# Patient Record
Sex: Male | Born: 1955 | Hispanic: No | Marital: Married | Smoking: Never smoker
Health system: Southern US, Community
[De-identification: ages and names within clinical notes are randomized; demographics above are authoritative.]

## PROBLEM LIST (undated history)

## (undated) DIAGNOSIS — I219 Acute myocardial infarction, unspecified: Secondary | ICD-10-CM

## (undated) DIAGNOSIS — M1990 Unspecified osteoarthritis, unspecified site: Secondary | ICD-10-CM

## (undated) DIAGNOSIS — F329 Major depressive disorder, single episode, unspecified: Secondary | ICD-10-CM

## (undated) DIAGNOSIS — I2510 Atherosclerotic heart disease of native coronary artery without angina pectoris: Secondary | ICD-10-CM

## (undated) DIAGNOSIS — R7301 Impaired fasting glucose: Secondary | ICD-10-CM

## (undated) DIAGNOSIS — F32A Depression, unspecified: Secondary | ICD-10-CM

## (undated) HISTORY — PX: CARDIAC CATHETERIZATION: SHX172

## (undated) HISTORY — PX: KNEE SURGERY: SHX244

## (undated) HISTORY — PX: COLONOSCOPY: SHX5424

## (undated) HISTORY — PX: KNEE ARTHROSCOPY: SUR90

## (undated) HISTORY — PX: CORONARY ANGIOPLASTY: SHX604

---

## 2000-07-26 ENCOUNTER — Encounter (INDEPENDENT_AMBULATORY_CARE_PROVIDER_SITE_OTHER): Payer: Self-pay | Admitting: *Deleted

## 2000-07-26 LAB — CONVERTED CEMR LAB: PSA: 0.57 ng/mL

## 2000-08-21 ENCOUNTER — Encounter (INDEPENDENT_AMBULATORY_CARE_PROVIDER_SITE_OTHER): Payer: Self-pay | Admitting: *Deleted

## 2000-08-21 LAB — CONVERTED CEMR LAB: PSA: 0.57 ng/mL

## 2002-09-19 ENCOUNTER — Encounter: Payer: Self-pay | Admitting: Emergency Medicine

## 2002-09-19 ENCOUNTER — Emergency Department (HOSPITAL_COMMUNITY): Admission: EM | Admit: 2002-09-19 | Discharge: 2002-09-19 | Payer: Self-pay | Admitting: Emergency Medicine

## 2004-10-06 ENCOUNTER — Ambulatory Visit: Payer: Self-pay | Admitting: Internal Medicine

## 2005-09-16 ENCOUNTER — Ambulatory Visit: Payer: Self-pay | Admitting: Internal Medicine

## 2005-11-24 ENCOUNTER — Ambulatory Visit: Payer: Self-pay | Admitting: Family Medicine

## 2006-03-22 ENCOUNTER — Ambulatory Visit: Payer: Self-pay | Admitting: Internal Medicine

## 2006-09-26 ENCOUNTER — Ambulatory Visit: Payer: Self-pay | Admitting: Internal Medicine

## 2007-01-25 ENCOUNTER — Ambulatory Visit: Payer: Self-pay | Admitting: Family Medicine

## 2007-04-25 ENCOUNTER — Ambulatory Visit: Payer: Self-pay | Admitting: Internal Medicine

## 2007-04-25 ENCOUNTER — Telehealth (INDEPENDENT_AMBULATORY_CARE_PROVIDER_SITE_OTHER): Payer: Self-pay | Admitting: *Deleted

## 2007-04-25 ENCOUNTER — Encounter (INDEPENDENT_AMBULATORY_CARE_PROVIDER_SITE_OTHER): Payer: Self-pay | Admitting: *Deleted

## 2007-07-11 ENCOUNTER — Ambulatory Visit (HOSPITAL_BASED_OUTPATIENT_CLINIC_OR_DEPARTMENT_OTHER): Admission: RE | Admit: 2007-07-11 | Discharge: 2007-07-11 | Payer: Self-pay | Admitting: Orthopedic Surgery

## 2007-09-28 ENCOUNTER — Ambulatory Visit: Payer: Self-pay | Admitting: Family Medicine

## 2007-09-28 DIAGNOSIS — J069 Acute upper respiratory infection, unspecified: Secondary | ICD-10-CM | POA: Insufficient documentation

## 2008-07-24 ENCOUNTER — Telehealth (INDEPENDENT_AMBULATORY_CARE_PROVIDER_SITE_OTHER): Payer: Self-pay | Admitting: *Deleted

## 2008-09-08 ENCOUNTER — Ambulatory Visit: Payer: Self-pay | Admitting: Internal Medicine

## 2008-09-08 DIAGNOSIS — R002 Palpitations: Secondary | ICD-10-CM

## 2008-10-25 DIAGNOSIS — R1084 Generalized abdominal pain: Secondary | ICD-10-CM | POA: Insufficient documentation

## 2008-11-11 ENCOUNTER — Encounter (INDEPENDENT_AMBULATORY_CARE_PROVIDER_SITE_OTHER): Payer: Self-pay | Admitting: Internal Medicine

## 2008-11-11 ENCOUNTER — Ambulatory Visit: Payer: Self-pay | Admitting: Family Medicine

## 2008-11-11 LAB — CONVERTED CEMR LAB
Bacteria, UA: 0
Bilirubin Urine: NEGATIVE
Blood in Urine, dipstick: NEGATIVE
Epithelial cells, urine: 0 /lpf
Glucose, Urine, Semiquant: NEGATIVE
Ketones, urine, test strip: NEGATIVE
Nitrite: NEGATIVE
Protein, U semiquant: NEGATIVE
RBC / HPF: 0
Specific Gravity, Urine: 1.01
Urobilinogen, UA: 0.2
WBC Urine, dipstick: NEGATIVE
pH: 6

## 2008-12-05 ENCOUNTER — Ambulatory Visit: Payer: Self-pay | Admitting: Gastroenterology

## 2008-12-05 DIAGNOSIS — R1032 Left lower quadrant pain: Secondary | ICD-10-CM

## 2008-12-08 LAB — CONVERTED CEMR LAB
AST: 18 units/L (ref 0–37)
BUN: 21 mg/dL (ref 6–23)
Bacteria, UA: NEGATIVE
Basophils Absolute: 0 10*3/uL (ref 0.0–0.1)
Basophils Relative: 0.4 % (ref 0.0–3.0)
Bilirubin Urine: NEGATIVE
Calcium: 9.3 mg/dL (ref 8.4–10.5)
Chloride: 107 meq/L (ref 96–112)
Creatinine, Ser: 1.1 mg/dL (ref 0.4–1.5)
Crystals: NEGATIVE
Eosinophils Absolute: 0.2 10*3/uL (ref 0.0–0.7)
Hemoglobin, Urine: NEGATIVE
Hemoglobin: 15.5 g/dL (ref 13.0–17.0)
Ketones, ur: NEGATIVE mg/dL
Leukocytes, UA: NEGATIVE
MCHC: 34 g/dL (ref 30.0–36.0)
MCV: 82.1 fL (ref 78.0–100.0)
Neutro Abs: 5.2 10*3/uL (ref 1.4–7.7)
RBC: 5.21 M/uL (ref 4.22–5.81)
RDW: 13.6 % (ref 11.5–14.6)
Sed Rate: 7 mm/hr (ref 0–16)
Urobilinogen, UA: 0.2 (ref 0.0–1.0)

## 2008-12-10 ENCOUNTER — Ambulatory Visit: Payer: Self-pay | Admitting: Internal Medicine

## 2008-12-23 ENCOUNTER — Ambulatory Visit: Payer: Self-pay | Admitting: Gastroenterology

## 2009-01-02 ENCOUNTER — Ambulatory Visit: Payer: Self-pay | Admitting: Gastroenterology

## 2009-01-02 ENCOUNTER — Encounter: Payer: Self-pay | Admitting: Gastroenterology

## 2009-01-05 ENCOUNTER — Encounter: Payer: Self-pay | Admitting: Gastroenterology

## 2009-06-15 IMAGING — CT CT ABDOMEN W/ CM
2 of 5 series · 17 of 46 positions shown, 19 images · IV contrast (Omnipaque 300)
Comparison: None

CT ABDOMEN

CLINICAL DATA: Left flank pain radiating to the left lower
quadrant for approximately 6 weeks.  Bouts of diarrhea and
constipation.

CT ABDOMEN AND PELVIS WITH CONTRAST
TECHNIQUE: Multidetector CT imaging of the abdomen and pelvis was
performed using the standard protocol following bolus
administration of intravenous contrast.
Contrast: 100 ml Rmnipaque-RFF intravenously.  Oral contrast was
given.

[Series 2: abd_pel 5.0 b30f st · axial · 0.86mm/px · z∈[-403,-3]mm · 14 of 90 slices shown, 16 images]
[im 5/90  soft-tissue]
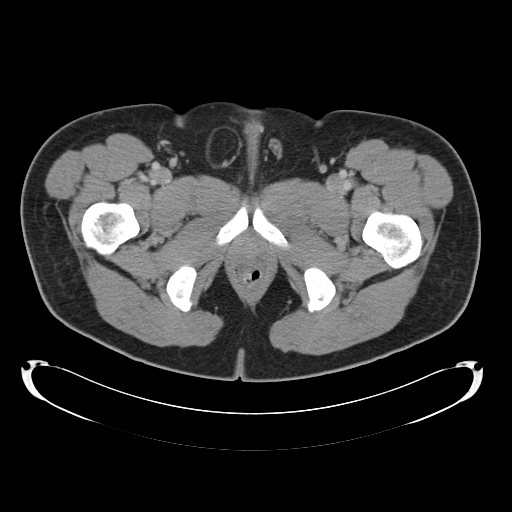
[im 5/90  bone]
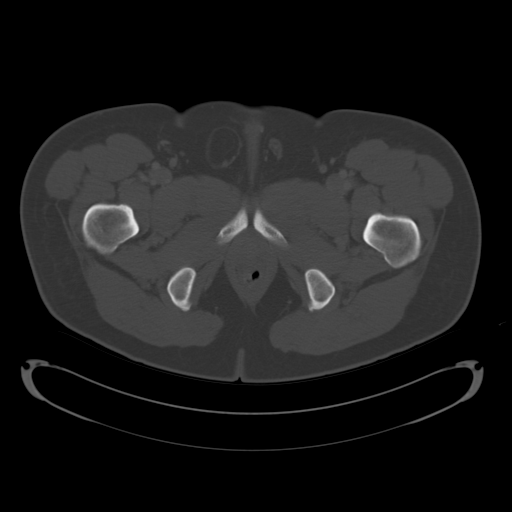
[im 10/90  soft-tissue]
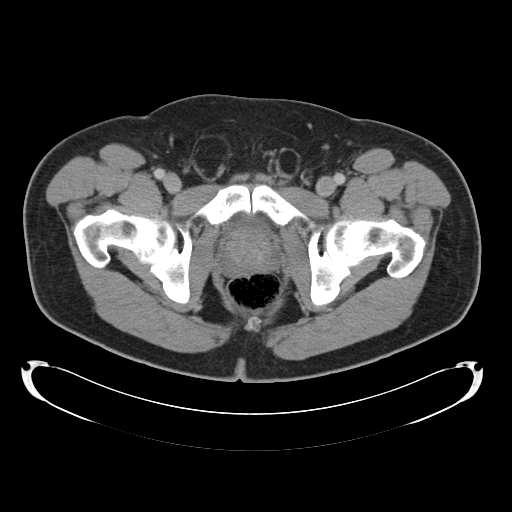
[im 20/90  soft-tissue]
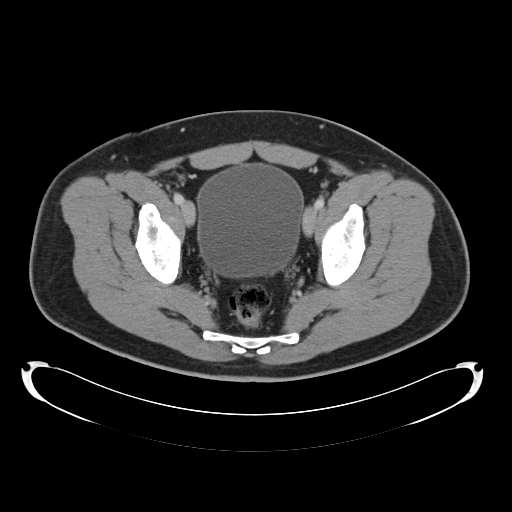
[im 25/90  soft-tissue]
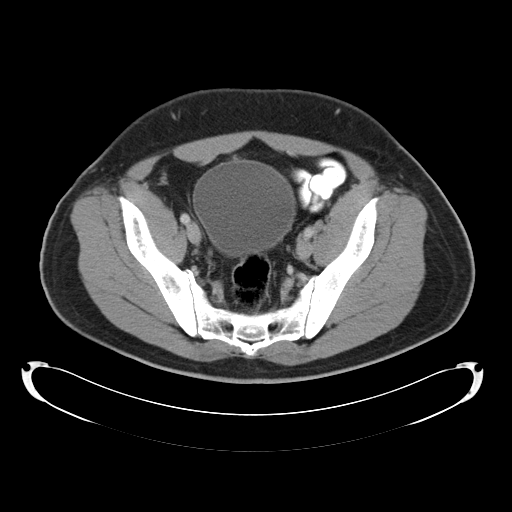
[im 30/90  soft-tissue]
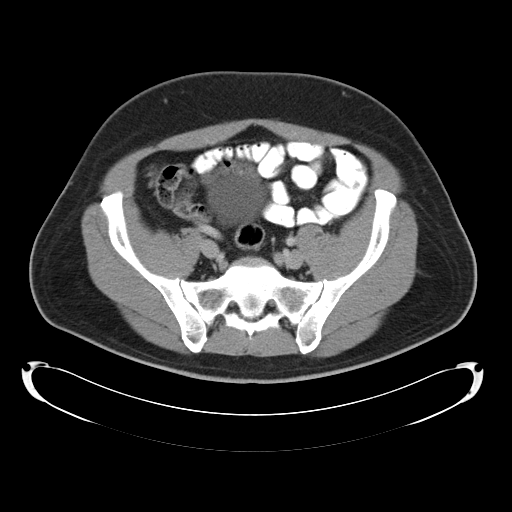
[im 35/90  soft-tissue]
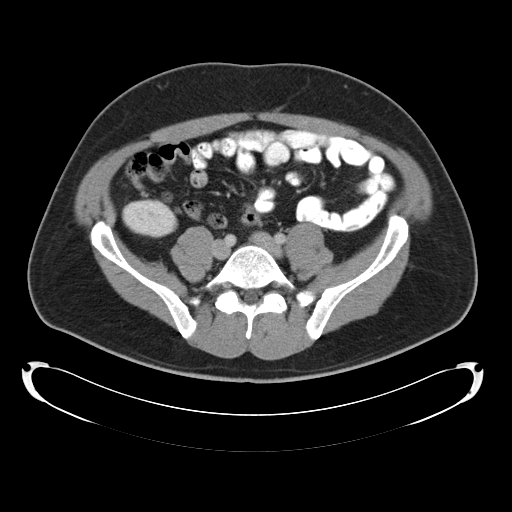
[im 40/90  soft-tissue]
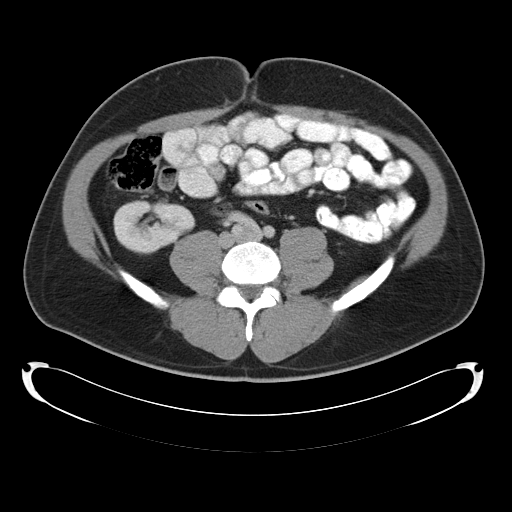
[im 50/90  soft-tissue]
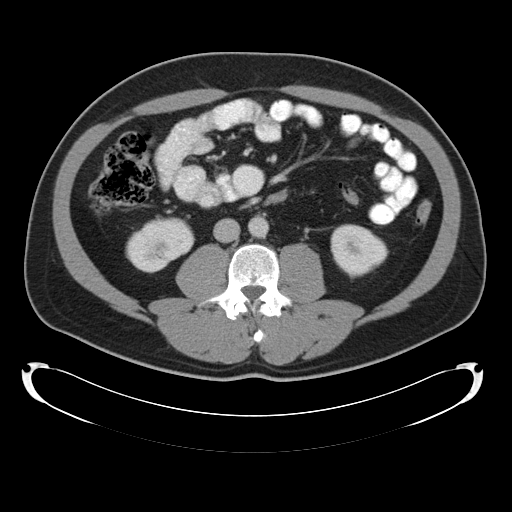
[im 55/90  soft-tissue]
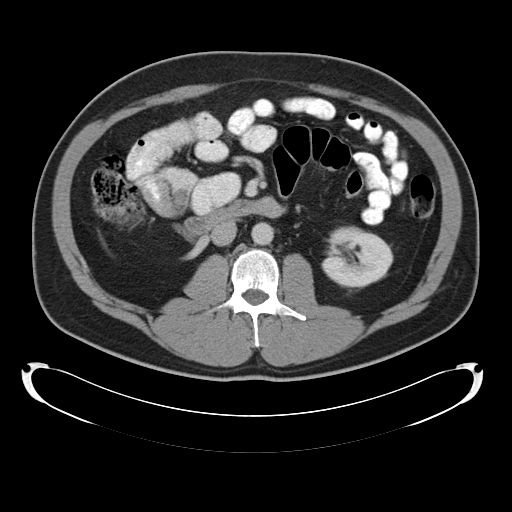
[im 55/90  bone]
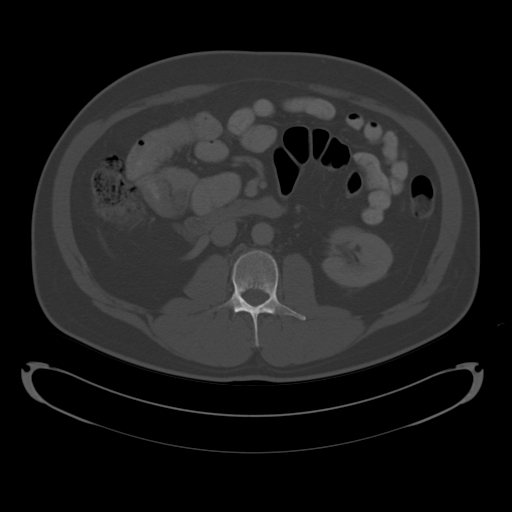
[im 60/90  soft-tissue]
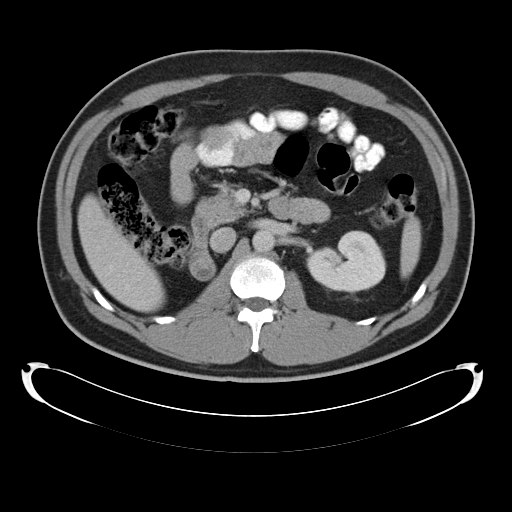
[im 65/90  soft-tissue]
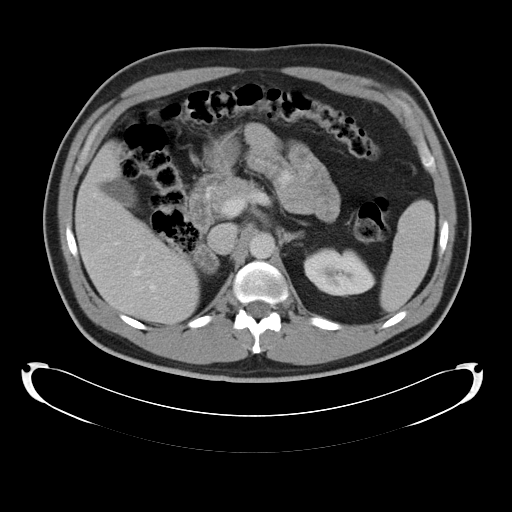
[im 70/90  soft-tissue]
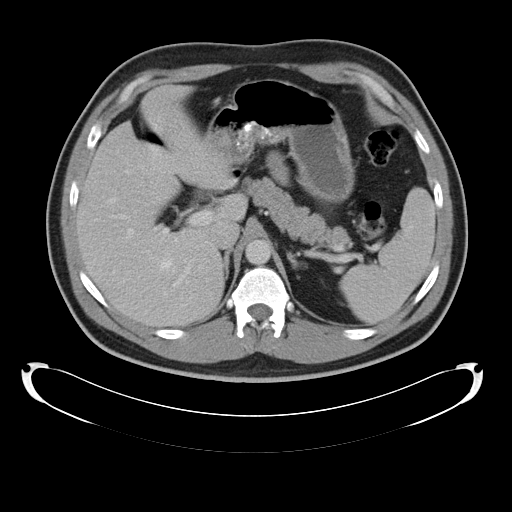
[im 80/90  soft-tissue]
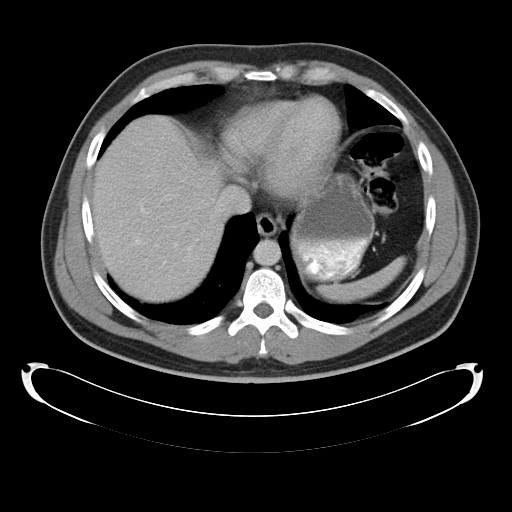
[im 85/90  soft-tissue]
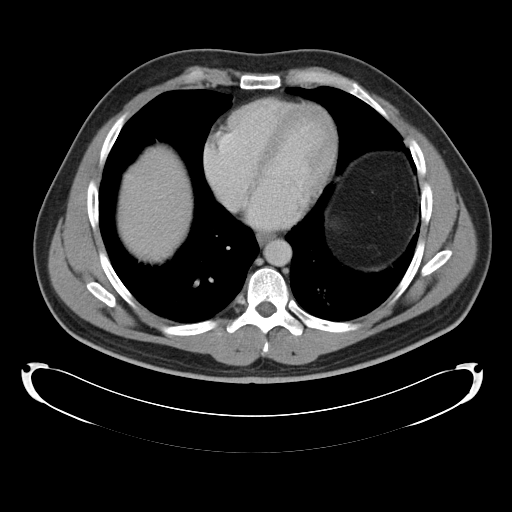

[Series 602: <mpr thick range> · coronal · 0.89mm/px · 3 of 90 slices shown]
[im 30/90  soft-tissue]
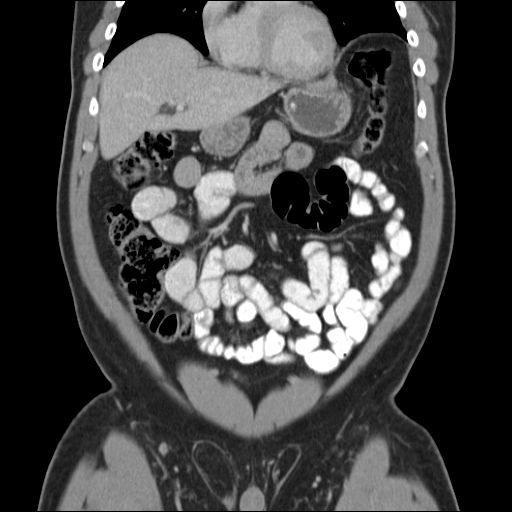
[im 40/90  soft-tissue]
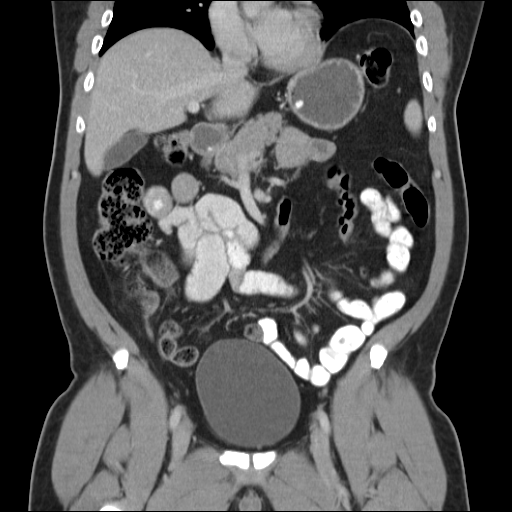
[im 50/90  soft-tissue]
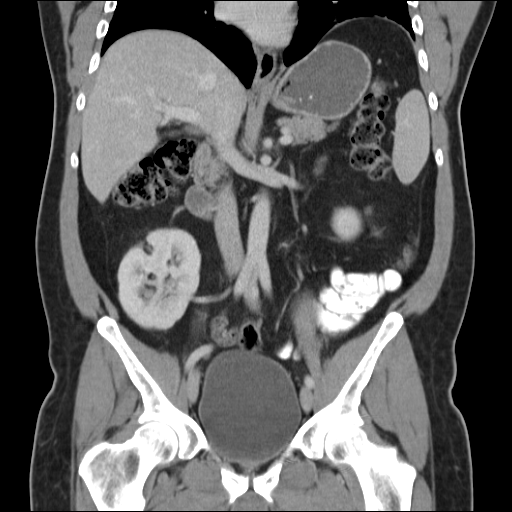

[17 of 46 positions shown; findings below may reference images not displayed]

FINDINGS: The lung bases are clear.  There is no pleural effusion.
The liver, spleen, gallbladder, pancreas and adrenal glands appear
normal.

The left kidney appears normal.  The right kidney is located at the
level of the iliac crest and is malrotated.  Although this study is
not a dedicated CTA, the primary right renal artery appears to be
arising from the right common iliac artery.  A smaller branch
arising from the aorta supplies the upper pole.  There is no
hydronephrosis or focal renal abnormality.

The bowel gas pattern is normal.  No intra-abdominal inflammatory
changes are identified.  There is no adenopathy.
IMPRESSION: 1.  No acute abdominal findings demonstrated.  There is no
explanation for left flank pain.
2.  Malpositioned right kidney within the upper false pelvis with
aberrant right renal artery mainly from the right common iliac
artery.

CT PELVIS
FINDINGS: There is no pelvic mass, fluid collection or
inflammatory process.  No ureteral calculi are identified.  The
bladder, prostate gland and seminal vesicles appear normal.
Asymmetric fat is present within the right inguinal canal.  There
is no herniated bowel.
IMPRESSION: 1.  No acute pelvic findings.
2.  Right inguinal hernia containing fat.

## 2010-12-19 LAB — CONVERTED CEMR LAB
ALT: 27 units/L (ref 0–53)
AST: 30 units/L (ref 0–37)
Albumin: 4 g/dL (ref 3.5–5.2)
Alkaline Phosphatase: 75 units/L (ref 39–117)
BUN: 18 mg/dL (ref 6–23)
Basophils Absolute: 0 10*3/uL (ref 0.0–0.1)
Basophils Relative: 0.1 % (ref 0.0–3.0)
Bilirubin, Direct: 0.1 mg/dL (ref 0.0–0.3)
CO2: 28 meq/L (ref 19–32)
Calcium: 9.1 mg/dL (ref 8.4–10.5)
Chloride: 106 meq/L (ref 96–112)
Cholesterol: 157 mg/dL (ref 0–200)
Creatinine, Ser: 1 mg/dL (ref 0.4–1.5)
Eosinophils Absolute: 0.1 10*3/uL (ref 0.0–0.7)
Eosinophils Relative: 1.2 % (ref 0.0–5.0)
GFR calc Af Amer: 101 mL/min
GFR calc non Af Amer: 83 mL/min
Glucose, Bld: 95 mg/dL (ref 70–99)
HCT: 45.9 % (ref 39.0–52.0)
HDL: 33.2 mg/dL — ABNORMAL LOW (ref >39.0)
Hemoglobin: 15.7 g/dL (ref 13.0–17.0)
LDL Cholesterol: 107 mg/dL — ABNORMAL HIGH (ref 0–99)
Lymphocytes Relative: 14.3 % (ref 12.0–46.0)
MCHC: 34.2 g/dL (ref 30.0–36.0)
MCV: 85.7 fL (ref 78.0–100.0)
Monocytes Absolute: 0.6 10*3/uL (ref 0.1–1.0)
Monocytes Relative: 7.3 % (ref 3.0–12.0)
Neutro Abs: 6.2 10*3/uL (ref 1.4–7.7)
Neutrophils Relative %: 77.1 % — ABNORMAL HIGH (ref 43.0–77.0)
PSA: 1.62 ng/mL (ref 0.10–4.00)
Phosphorus: 2.9 mg/dL (ref 2.3–4.6)
Platelets: 183 10*3/uL (ref 150–400)
Potassium: 4 meq/L (ref 3.5–5.1)
RBC: 5.36 M/uL (ref 4.22–5.81)
RDW: 12.7 % (ref 11.5–14.6)
Sodium: 142 meq/L (ref 135–145)
TSH: 1.48 microintl units/mL (ref 0.35–5.50)
Total Bilirubin: 1 mg/dL (ref 0.3–1.2)
Total CHOL/HDL Ratio: 4.7
Total Protein: 6.8 g/dL (ref 6.0–8.3)
Triglycerides: 85 mg/dL (ref 0–149)
VLDL: 17 mg/dL (ref 0–40)
WBC: 8 10*3/uL (ref 4.5–10.5)

## 2011-04-05 NOTE — Op Note (Signed)
NAMEJONATHA, Maurice Rios             ACCOUNT NO.:  [id number]   MEDICAL RECORD NO.:  [id number]          PATIENT TYPE:  AMB   LOCATION:  DSC                          FACILITY:  [facility]   PHYSICIAN:  Mila Homer. Sherlean Foot, M.D. DATE OF BIRTH:  1956/04/07   DATE OF PROCEDURE:  07/11/2007  DATE OF DISCHARGE:                               OPERATIVE REPORT   SURGEON:  Mila Homer. Sherlean Foot, M.D.   ASSISTANT:  None.   ANESTHESIA:  MAC.   PREOPERATIVE DIAGNOSIS:  Right knee medial meniscus tear,  osteoarthritis.   POSTOPERATIVE DIAGNOSIS:  Right knee medial meniscus tear,  osteoarthritis, plica syndrome.   PROCEDURE:  Right knee arthroscopy with partial medial meniscectomy,  chondroplasty in the medial compartment, plica resection of the  patellofemoral compartment.   INDICATIONS FOR PROCEDURE:  The patient is a 55 year old with mechanical  symptoms and MRI evidence of meniscus tear.  Informed consent was  obtained.   DESCRIPTION OF PROCEDURE:  The patient was laid supine, administered MAC  anesthesia.  Right leg was prepped and draped in usual sterile fashion.  Inferolateral and inferomedial portals were created with a #11 blade,  blunt trocar and cannula.  Diagnostic arthroscopy revealed  chondromalacia of the medial compartment as well as a very complex tear  of the medial meniscus.  Chondroplasty was performed in that medial  compartment as was a partial medial meniscectomy with straight basket  forceps and the [employer] shaver.  ACL and PCL were normal.  Went to  the figure four position, lateral compartment was normal.  Patellofemoral compartment was also normal, had a very large synovitic  hyperemic plica.  This was debrided with a [employer].  Then  lavaged and closed with 4-0 nylon sutures, dressed with Xeroform  dressing sponges, sterile Webril and Ace wrap.  Complications none.  Drains none.           ______________________________  Mila Homer. Sherlean Foot, M.D.     SDL/MEDQ  D:  07/11/2007  T:  07/12/2007  Job:  [id number]

## 2013-11-06 ENCOUNTER — Encounter: Payer: Self-pay | Admitting: Gastroenterology

## 2014-06-05 ENCOUNTER — Encounter: Payer: Self-pay | Admitting: Gastroenterology

## 2015-10-27 ENCOUNTER — Encounter: Payer: Self-pay | Admitting: Gastroenterology

## 2017-01-20 ENCOUNTER — Other Ambulatory Visit
Admission: RE | Admit: 2017-01-20 | Discharge: 2017-01-20 | Disposition: A | Discharge status: Home / Self Care | Payer: BLUE CROSS/BLUE SHIELD | Source: Ambulatory Visit | Attending: Family Medicine | Admitting: Family Medicine

## 2017-01-20 ENCOUNTER — Inpatient Hospital Stay
Admission: EM | Admit: 2017-01-20 | Discharge: 2017-01-24 | DRG: 247 | Disposition: A | Discharge status: Home / Self Care | Payer: BLUE CROSS/BLUE SHIELD | Attending: Internal Medicine | Admitting: Internal Medicine

## 2017-01-20 ENCOUNTER — Emergency Department: Payer: BLUE CROSS/BLUE SHIELD

## 2017-01-20 ENCOUNTER — Other Ambulatory Visit: Payer: Self-pay

## 2017-01-20 ENCOUNTER — Encounter: Payer: Self-pay | Admitting: Emergency Medicine

## 2017-01-20 DIAGNOSIS — I10 Essential (primary) hypertension: Secondary | ICD-10-CM | POA: Diagnosis present

## 2017-01-20 DIAGNOSIS — N390 Urinary tract infection, site not specified: Secondary | ICD-10-CM | POA: Diagnosis present

## 2017-01-20 DIAGNOSIS — E860 Dehydration: Secondary | ICD-10-CM | POA: Diagnosis present

## 2017-01-20 DIAGNOSIS — Z8249 Family history of ischemic heart disease and other diseases of the circulatory system: Secondary | ICD-10-CM | POA: Diagnosis not present

## 2017-01-20 DIAGNOSIS — R9431 Abnormal electrocardiogram [ECG] [EKG]: Secondary | ICD-10-CM | POA: Diagnosis not present

## 2017-01-20 DIAGNOSIS — R072 Precordial pain: Secondary | ICD-10-CM | POA: Diagnosis present

## 2017-01-20 DIAGNOSIS — I214 Non-ST elevation (NSTEMI) myocardial infarction: Principal | ICD-10-CM | POA: Diagnosis present

## 2017-01-20 DIAGNOSIS — R079 Chest pain, unspecified: Secondary | ICD-10-CM | POA: Diagnosis present

## 2017-01-20 DIAGNOSIS — K5900 Constipation, unspecified: Secondary | ICD-10-CM | POA: Diagnosis present

## 2017-01-20 DIAGNOSIS — R52 Pain, unspecified: Secondary | ICD-10-CM

## 2017-01-20 HISTORY — DX: Impaired fasting glucose: R73.01

## 2017-01-20 HISTORY — DX: Depression, unspecified: F32.A

## 2017-01-20 HISTORY — DX: Unspecified osteoarthritis, unspecified site: M19.90

## 2017-01-20 HISTORY — DX: Major depressive disorder, single episode, unspecified: F32.9

## 2017-01-20 LAB — CBC WITH DIFFERENTIAL/PLATELET
Basophils Absolute: 0 10*3/uL (ref 0–0.1)
Basophils Relative: 1 %
EOS PCT: 1 %
Eosinophils Absolute: 0.1 10*3/uL (ref 0–0.7)
HCT: 43.4 % (ref 40.0–52.0)
Hemoglobin: 15.1 g/dL (ref 13.0–18.0)
LYMPHS ABS: 1.5 10*3/uL (ref 1.0–3.6)
LYMPHS PCT: 15 %
MCH: 28.5 pg (ref 26.0–34.0)
MCHC: 34.7 g/dL (ref 32.0–36.0)
MCV: 82 fL (ref 80.0–100.0)
MONO ABS: 0.8 10*3/uL (ref 0.2–1.0)
MONOS PCT: 8 %
Neutro Abs: 7.3 10*3/uL — ABNORMAL HIGH (ref 1.4–6.5)
Neutrophils Relative %: 75 %
PLATELETS: 199 10*3/uL (ref 150–440)
RBC: 5.3 MIL/uL (ref 4.40–5.90)
RDW: 15.4 % — ABNORMAL HIGH (ref 11.5–14.5)
WBC: 9.6 10*3/uL (ref 3.8–10.6)

## 2017-01-20 LAB — COMPREHENSIVE METABOLIC PANEL
ALBUMIN: 4.5 g/dL (ref 3.5–5.0)
ALT: 23 U/L (ref 17–63)
AST: 28 U/L (ref 15–41)
Alkaline Phosphatase: 91 U/L (ref 38–126)
Anion gap: 8 (ref 5–15)
BUN: 24 mg/dL — AB (ref 6–20)
CHLORIDE: 106 mmol/L (ref 101–111)
CO2: 24 mmol/L (ref 22–32)
Calcium: 9.2 mg/dL (ref 8.9–10.3)
Creatinine, Ser: 1.14 mg/dL (ref 0.61–1.24)
GFR calc Af Amer: >60 mL/min (ref >60)
GLUCOSE: 117 mg/dL — AB (ref 65–99)
Potassium: 3.6 mmol/L (ref 3.5–5.1)
SODIUM: 138 mmol/L (ref 135–145)
Total Bilirubin: 0.9 mg/dL (ref 0.3–1.2)
Total Protein: 7.4 g/dL (ref 6.5–8.1)

## 2017-01-20 LAB — PROTIME-INR
INR: 1.12
Prothrombin Time: 14.5 seconds (ref 11.4–15.2)

## 2017-01-20 LAB — TROPONIN I
TROPONIN I: 1.76 ng/mL — AB (ref <0.03)
TROPONIN I: 1.66 ng/mL — AB (ref <0.03)
TROPONIN I: 1.72 ng/mL — AB (ref <0.03)

## 2017-01-20 LAB — CKMB (ARMC ONLY): CK, MB: 6.7 ng/mL — ABNORMAL HIGH (ref 0.5–5.0)

## 2017-01-20 LAB — CK: Total CK: 208 U/L (ref 49–397)

## 2017-01-20 LAB — APTT: aPTT: 33 seconds (ref 24–36)

## 2017-01-20 LAB — BRAIN NATRIURETIC PEPTIDE: B Natriuretic Peptide: 27 pg/mL (ref 0.0–100.0)

## 2017-01-20 MED ORDER — SENNOSIDES-DOCUSATE SODIUM 8.6-50 MG PO TABS
1.0000 | ORAL_TABLET | Freq: Every evening | ORAL | Status: DC | PRN
  Administered 2017-01-21 – 2017-01-22 (×2): 1 via ORAL
  Filled 2017-01-20 (×2): qty 1

## 2017-01-20 MED ORDER — SODIUM CHLORIDE 0.9 % IV SOLN
INTRAVENOUS | Status: DC
  Administered 2017-01-20: 20:00:00 via INTRAVENOUS

## 2017-01-20 MED ORDER — ATORVASTATIN CALCIUM 20 MG PO TABS
40.0000 mg | ORAL_TABLET | Freq: Every day | ORAL | Status: DC
  Administered 2017-01-21 – 2017-01-23 (×3): 40 mg via ORAL
  Filled 2017-01-20 (×3): qty 2

## 2017-01-20 MED ORDER — ASPIRIN 81 MG PO CHEW
324.0000 mg | CHEWABLE_TABLET | Freq: Once | ORAL | Status: AC
  Administered 2017-01-20: 324 mg via ORAL
  Filled 2017-01-20: qty 4

## 2017-01-20 MED ORDER — NITROGLYCERIN 0.4 MG SL SUBL: 0.4000 mg | SUBLINGUAL_TABLET | SUBLINGUAL | Status: DC | PRN

## 2017-01-20 MED ORDER — ENOXAPARIN SODIUM 40 MG/0.4ML ~~LOC~~ SOLN
40.0000 mg | SUBCUTANEOUS | Status: DC
  Administered 2017-01-20 – 2017-01-23 (×4): 40 mg via SUBCUTANEOUS
  Filled 2017-01-20 (×4): qty 0.4

## 2017-01-20 MED ORDER — ACETAMINOPHEN 325 MG PO TABS: 650.0000 mg | ORAL_TABLET | ORAL | Status: DC | PRN

## 2017-01-20 MED ORDER — ONDANSETRON HCL 4 MG/2ML IJ SOLN: 4.0000 mg | Freq: Four times a day (QID) | INTRAMUSCULAR | Status: DC | PRN

## 2017-01-20 MED ORDER — ASPIRIN EC 325 MG PO TBEC
325.0000 mg | DELAYED_RELEASE_TABLET | Freq: Every day | ORAL | Status: DC
  Administered 2017-01-21 – 2017-01-22 (×2): 325 mg via ORAL
  Filled 2017-01-20 (×2): qty 1

## 2017-01-20 MED ORDER — METOPROLOL TARTRATE 25 MG PO TABS
12.5000 mg | ORAL_TABLET | Freq: Two times a day (BID) | ORAL | Status: DC
  Administered 2017-01-20 – 2017-01-24 (×7): 12.5 mg via ORAL
  Filled 2017-01-20 (×4): qty 1
  Filled 2017-01-20: qty 2
  Filled 2017-01-20 (×2): qty 1

## 2017-01-20 NOTE — H&P (Addendum)
[facility] [place] [facility]   PATIENT NAME: Maurice Rios    MR#:  [id number]  DATE OF BIRTH:  1956-04-12  DATE OF ADMISSION:  01/20/2017  PRIMARY CARE PHYSICIAN: Dorothey BasemanAVID BRONSTEIN, MD   REQUESTING/REFERRING PHYSICIAN: Merrily BrittleNeil Rifenbark, MD  CHIEF COMPLAINT:   Chief Complaint  Patient presents with  . Chest Pain   Chest pain 2 days ago. HISTORY OF PRESENT ILLNESS:  Maurice Rios  is a 61 y.o. male with a known history of Arthritis and depression. The patient had a 1 episode of chest pain 2 days ago, which resolved. He denies any symptoms. He went to PCPs office today for checkup. He was found abnormal EKG and elevated troponin at 1.76. Per ED physician, cardiologist Dr. Darrold JunkerParaschos ssuggests aspirin and echocardiograph but no heparin drip at this time.  PAST MEDICAL HISTORY:   Past Medical History:  Diagnosis Date  . Arthritis   . Depression   . Impaired fasting glucose     PAST SURGICAL HISTORY:   Past Surgical History:  Procedure Laterality Date  . KNEE ARTHROSCOPY    . KNEE SURGERY      SOCIAL HISTORY:   Social History  Substance Use Topics  . Smoking status: Never Smoker  . Smokeless tobacco: Never Used  . Alcohol use 0.6 oz/week    1 Cans of beer per week    FAMILY HISTORY:   Family History  Problem Relation Age of Onset  . Hypertension Father   . Kidney disease Father   . Ovarian cancer Mother   . Heart attack Mother     DRUG ALLERGIES:  No Known Allergies  REVIEW OF SYSTEMS:   Review of Systems  Constitutional: Negative for chills, fever and malaise/fatigue.  HENT: Negative for congestion.   Respiratory: Negative for cough, shortness of breath and stridor.   Cardiovascular: Positive for chest pain. Negative for palpitations, orthopnea and leg swelling.  Gastrointestinal: Positive for constipation. Negative for abdominal pain, blood in stool, diarrhea, melena, nausea and vomiting.  Genitourinary: Negative for dysuria  and hematuria.  Musculoskeletal: Negative for joint pain.  Skin: Negative for itching and rash.  Neurological: Negative for dizziness, focal weakness, loss of consciousness, weakness and headaches.  Psychiatric/Behavioral: Negative for depression. The patient does not have insomnia.        Stress from family    MEDICATIONS AT HOME:   Prior to Admission medications   Not on File      VITAL SIGNS:  Blood pressure (!) 143/74, pulse 76, temperature 98.1 F (36.7 C), temperature source Oral, resp. rate (!) 21, height 5\' 6"  (1.676 m), weight 195 lb (88.5 kg), SpO2 98 %.  PHYSICAL EXAMINATION:  Physical Exam  GENERAL:  61 y.o.-year-old patient lying in the bed with no acute distress.  EYES: Pupils equal, round, reactive to light and accommodation. No scleral icterus. Extraocular muscles intact.  HEENT: Head atraumatic, normocephalic. Oropharynx and nasopharynx clear.  NECK:  Supple, no jugular venous distention. No thyroid enlargement, no tenderness.  LUNGS: Normal breath sounds bilaterally, no wheezing, rales,rhonchi or crepitation. No use of accessory muscles of respiration.  CARDIOVASCULAR: S1, S2 normal. No murmurs, rubs, or gallops.  ABDOMEN: Soft, nontender, nondistended. Bowel sounds present. No organomegaly or mass.  EXTREMITIES: No pedal edema, cyanosis, or clubbing.  NEUROLOGIC: Cranial nerves II through XII are intact. Muscle strength 5/5 in all extremities. Sensation intact. Gait not checked.  PSYCHIATRIC: The patient is alert and oriented x 3.  SKIN: No obvious rash, lesion,  or ulcer.   LABORATORY PANEL:   CBC  Recent Labs Lab 01/20/17 1745  WBC 9.6  HGB 15.1  HCT 43.4  PLT 199   ------------------------------------------------------------------------------------------------------------------  Chemistries   Recent Labs Lab 01/20/17 1745  NA 138  K 3.6  CL 106  CO2 24  GLUCOSE 117*  BUN 24*  CREATININE 1.14  CALCIUM 9.2  AST 28  ALT 23  ALKPHOS 91    BILITOT 0.9   ------------------------------------------------------------------------------------------------------------------  Cardiac Enzymes  Recent Labs Lab 01/20/17 1745  TROPONINI 1.66*   ------------------------------------------------------------------------------------------------------------------  RADIOLOGY:  Dg Chest Port 1 View  Result Date: 01/20/2017 CLINICAL DATA:  61 y/o  M; NSTEMI. EXAM: PORTABLE CHEST 1 VIEW COMPARISON:  None. FINDINGS: The heart size and mediastinal contours are within normal limits. Both lungs are clear. The visualized skeletal structures are unremarkable. IMPRESSION: No active disease. Electronically Signed   By: Mitzi Hansen M.D.   On: 01/20/2017 18:17      IMPRESSION AND PLAN:   Non-STEMI. The patient will be admitted to medical floor. He was treated with aspirin 325 mg 1 dose in the ED, I will continue aspirin, add Lipitor and low-dose Lopressor, check lipid panel and echocardiograph. Follow-up cardiology consult. Follow-up troponin level, nothing by mouth after midnight in case for cardiac catheter tomorrow.  Dehydration. Start normal saline IV and follow-up BMP.  All the records are reviewed and case discussed with ED provider. Management plans discussed with the patient, his wife and they are in agreement.  CODE STATUS: Full code  TOTAL TIME TAKING CARE OF THIS PATIENT: 50 minutes.    Shaune Pollack M.D on 01/20/2017 at 6:42 PM  Between 7am to 6pm - Pager - [phone]  After 6pm go to [email or web address] - Social research officer, government  [facility] [place] [facility]  Office  [phone]  CC: Primary care physician; Dorothey Baseman, MD   Note: This dictation was prepared with Dragon dictation along with smaller phrase technology. Any transcriptional errors that result from this process are unintentional.

## 2017-01-20 NOTE — ED Provider Notes (Signed)
[facility] Emergency Department Provider Note  ____________________________________________   First MD Initiated Contact with Patient 01/20/17 1742     (approximate)  I have reviewed the triage vital signs and the nursing notes.   HISTORY  Chief Complaint Chest Pain    HPI Maurice Rios is a 61 y.o. male comes to the emergency department at the request of his primary care physician for an elevated troponin. 2 days ago he was at home mowing the lawn when he began to feel severe crushing substernal left chest pain and shortness of breath. It improved with rest and worsened with exertion. His wife called 911 and when EMS arrived the patient was no longer symptomatic and he had a normal EKG. EMS recommended the patient come to the emergency department for further evaluation but she declined. Today he went to his primary care physician who checked an outpatient troponin which was positive so he sent him to the emergency department. The patient has no coronary artery history. He has a history of diabetes or hypertension. He's had no pain since Wednesday. He's never had a DVT or pulmonary embolism. Pain was not ripping or tearing and did not go to his back. He did not vomit.   Past Medical History:  Diagnosis Date  . Arthritis   . Depression   . Impaired fasting glucose     Patient Active Problem List   Diagnosis Date Noted  . NSTEMI (non-ST elevated myocardial infarction) (HCC) 01/20/2017  . ABDOMINAL PAIN-LLQ 12/05/2008  . ABDOMINAL PAIN, GENERALIZED 10/25/2008  . PALPITATIONS 09/08/2008  . URI 09/28/2007    Past Surgical History:  Procedure Laterality Date  . KNEE ARTHROSCOPY    . KNEE SURGERY      Prior to Admission medications   Not on File    Allergies Patient has no known allergies.  Family History  Problem Relation Age of Onset  . Hypertension Father   . Kidney disease Father   . Ovarian cancer Mother     Social History Social  History  Substance Use Topics  . Smoking status: Never Smoker  . Smokeless tobacco: Never Used  . Alcohol use 0.6 oz/week    1 Cans of beer per week    Review of Systems Constitutional: No fever/chills Eyes: No visual changes. ENT: No sore throat. Cardiovascular: Denies chest pain. Respiratory: Denies shortness of breath. Gastrointestinal: No abdominal pain.  No nausea, no vomiting.  No diarrhea.  No constipation. Genitourinary: Negative for dysuria. Musculoskeletal: Negative for back pain. Skin: Negative for rash. Neurological: Negative for headaches, focal weakness or numbness.  10-point ROS otherwise negative.  ____________________________________________   PHYSICAL EXAM:  VITAL SIGNS: ED Triage Vitals [01/20/17 1742]  Enc Vitals Group     BP      Pulse Rate 71     Resp 15     Temp 98.1 F (36.7 C)     Temp Source Oral     SpO2 95 %     Weight      Height      Head Circumference      Peak Flow      Pain Score      Pain Loc      Pain Edu?      Excl. in GC?     Constitutional: Alert and oriented x 4 well appearing nontoxic no diaphoresis speaks in full, clear sentences Eyes: PERRL EOMI. Head: Atraumatic. Nose: No congestion/rhinnorhea. Mouth/Throat: No trismus Neck: No stridor.  Cardiovascular: Normal rate, regular rhythm. Grossly normal heart sounds.  Good peripheral circulation. Respiratory: Normal respiratory effort.  No retractions. Lungs CTAB and moving good air Gastrointestinal: Soft nondistended nontender no rebound no guarding no peritonitis no McBurney's tenderness negative Rovsing's no costovertebral tenderness negative Murphy's Musculoskeletal: No lower extremity edema   Neurologic:  Normal speech and language. No gross focal neurologic deficits are appreciated. Skin:  Skin is warm, dry and intact. No rash noted. Psychiatric: Mood and affect are normal. Speech and behavior are normal.  ____________________________________________    LABS (all labs ordered are listed, but only abnormal results are displayed)  Labs Reviewed  COMPREHENSIVE METABOLIC PANEL - Abnormal; Notable for the following:       Result Value   Glucose, Bld 117 (*)    BUN 24 (*)    All other components within normal limits  CBC WITH DIFFERENTIAL/PLATELET - Abnormal; Notable for the following:    RDW 15.4 (*)    Neutro Abs 7.3 (*)    All other components within normal limits  TROPONIN I - Abnormal; Notable for the following:    Troponin I 1.66 (*)    All other components within normal limits  BRAIN NATRIURETIC PEPTIDE  PROTIME-INR  APTT  TROPONIN I  TROPONIN I   _Elevated troponin consistent with cardiac damage ___________________________________________  EKG  ED ECG REPORT I, Merrily Brittle, the attending physician, personally viewed and interpreted this ECG.  Date: 01/20/2017 EKG Time: 1741 Rate: 66 Rhythm: normal sinus rhythm QRS Axis: normal Intervals: normal ST/T Wave abnormalities: T-wave inversion in V3 V4 and biphasic T waves V5 no ST elevation Conduction Disturbances: none Narrative Interpretation: Consistent with ischemia  ____________________________________________  RADIOLOGY  Chest x-ray unremarkable ____________________________________________   PROCEDURES  Procedure(s) performed: no  Procedures  Critical Care performed: yes  CRITICAL CARE Performed by: Merrily Brittle   Total critical care time: 35 minutes  Critical care time was exclusive of separately billable procedures and treating other patients.  Critical care was necessary to treat or prevent imminent or life-threatening deterioration.  Critical care was time spent personally by me on the following activities: development of treatment plan with patient and/or surrogate as well as nursing, discussions with consultants, evaluation of patient's response to treatment, examination of patient, obtaining history from patient or surrogate, ordering  and performing treatments and interventions, ordering and review of laboratory studies, ordering and review of radiographic studies, pulse oximetry and re-evaluation of patient's condition.   ____________________________________________   INITIAL IMPRESSION / ASSESSMENT AND PLAN / ED COURSE  Pertinent labs & imaging results that were available during my care of the patient were reviewed by me and considered in my medical decision making (see chart for details).  On arrival the patient is very well-appearing and not currently having any active chest pain. His outpatient troponin today was 1.76 which may represent ongoing cardiac ischemia versus a completed event    ----------------------------------------- 5:57 PM on 01/20/2017 -----------------------------------------  I spoke with Dr. Darrold Junker of cardiology who recommends inpatient admission to trend troponins and an echocardiogram. He says that because the patient is asymptomatic at this time he does not recommend heparin.  I discussed the case with the hospitalist who is graciously agreed to admit the patient to his service. The patient's second troponin is down trending likely representing an event 2 days ago. He is still extremely high risk ____________________________________________   FINAL CLINICAL IMPRESSION(S) / ED DIAGNOSES  Final diagnoses:  NSTEMI (non-ST elevated myocardial infarction) (HCC)  NEW MEDICATIONS STARTED DURING THIS VISIT:  New Prescriptions   No medications on file     Note:  This document was prepared using Dragon voice recognition software and may include unintentional dictation errors.     Merrily Brittle, MD 01/20/17 [phone]

## 2017-01-20 NOTE — ED Triage Notes (Signed)
Patient comes to ED via POV from his pcp office. Patient was called and told he needed to come to the ED asap due to an elevated troponin, 1.75. Patient states the pain started yesterday while he was mowing the lawn. Patient states he would stop and take a deep breath and it would go away. Patient is pain free at this time. A&O 4.

## 2017-01-21 ENCOUNTER — Inpatient Hospital Stay (HOSPITAL_COMMUNITY)
Admit: 2017-01-21 | Discharge: 2017-01-21 | Disposition: A | Discharge status: Home / Self Care | Payer: BLUE CROSS/BLUE SHIELD | Attending: Emergency Medicine | Admitting: Emergency Medicine

## 2017-01-21 DIAGNOSIS — R9431 Abnormal electrocardiogram [ECG] [EKG]: Secondary | ICD-10-CM

## 2017-01-21 LAB — LIPID PANEL
CHOL/HDL RATIO: 4.4 ratio
CHOLESTEROL: 142 mg/dL (ref 0–200)
HDL: 32 mg/dL — AB (ref >40)
LDL Cholesterol: 86 mg/dL (ref 0–99)
TRIGLYCERIDES: 121 mg/dL (ref <150)
VLDL: 24 mg/dL (ref 0–40)

## 2017-01-21 LAB — BASIC METABOLIC PANEL
Anion gap: 5 (ref 5–15)
BUN: 20 mg/dL (ref 6–20)
CHLORIDE: 107 mmol/L (ref 101–111)
CO2: 26 mmol/L (ref 22–32)
CREATININE: 0.93 mg/dL (ref 0.61–1.24)
Calcium: 8.8 mg/dL — ABNORMAL LOW (ref 8.9–10.3)
GFR calc Af Amer: >60 mL/min (ref >60)
GFR calc non Af Amer: >60 mL/min (ref >60)
GLUCOSE: 110 mg/dL — AB (ref 65–99)
POTASSIUM: 3.6 mmol/L (ref 3.5–5.1)
Sodium: 138 mmol/L (ref 135–145)

## 2017-01-21 LAB — ECHOCARDIOGRAM COMPLETE
Height: 66 in
Weight: 3067.2 oz

## 2017-01-21 LAB — TROPONIN I
TROPONIN I: 1.13 ng/mL — AB (ref <0.03)
TROPONIN I: 1.04 ng/mL — AB (ref <0.03)
Troponin I: 1.29 ng/mL (ref <0.03)

## 2017-01-21 MED ORDER — ASPIRIN 81 MG PO CHEW
81.0000 mg | CHEWABLE_TABLET | ORAL | Status: AC
  Administered 2017-01-23: 81 mg via ORAL
  Filled 2017-01-21: qty 1

## 2017-01-21 MED ORDER — SODIUM CHLORIDE 0.9 % WEIGHT BASED INFUSION: 3.0000 mL/kg/h | INTRAVENOUS | Status: AC

## 2017-01-21 MED ORDER — SODIUM CHLORIDE 0.9 % WEIGHT BASED INFUSION
1.0000 mL/kg/h | INTRAVENOUS | Status: DC
Start: 2017-01-23 — End: 2017-01-23
  Administered 2017-01-23: 500 mL via INTRAVENOUS
  Administered 2017-01-23: 1 mL/kg/h via INTRAVENOUS

## 2017-01-21 NOTE — Consult Note (Signed)
[facility] Cardiology  CARDIOLOGY CONSULT NOTE  Patient ID: KAEGAN HETTICH MRN: [id number] DOB/AGE: [birth year] 61 y.o.  Admit date: 01/20/2017 Referring Physician [facility] Primary Physician [facility] [place] Primary Cardiologist  Reason for Consultation Non-STEMI  HPI: 61 year old gentleman referred for evaluation of non-STEMI. The patient was in his usual state of health until 01/17/2017 experienced substernal chest pain while mowing the yard. The patient called EMS, ECG was unremarkable. Chest pain resolved after approximately 45 minutes and the patient was reluctant for further evaluation [facility] emergency room. She had no recurrent chest pain, saw his primary care provider on 01/19/2017 at which time ECG revealed new T wave inversions in the anterolateral leads. Upon was checked and was elevated and was sent to [facility] emergency room. Admission labs were notable for bone is a 1.72, 1.29, 1.13. Patient currently denies chest pain.  Review of systems complete and found to be negative unless listed above     Past Medical History:  Diagnosis Date  . Arthritis   . Depression   . Impaired fasting glucose     Past Surgical History:  Procedure Laterality Date  . KNEE ARTHROSCOPY    . KNEE SURGERY      No prescriptions prior to admission.   Social History   Social History  . Marital status: Married    Spouse name: N/A  . Number of children: N/A  . Years of education: N/A   Occupational History  . Not on file.   Social History Main Topics  . Smoking status: Never Smoker  . Smokeless tobacco: Never Used  . Alcohol use 0.6 oz/week    1 Cans of beer per week  . Drug use: No  . Sexual activity: Not on file   Other Topics Concern  . Not on file   Social History Narrative  . No narrative on file    Family History  Problem Relation Age of Onset  . Hypertension Father   . Kidney disease Father   . Ovarian cancer Mother   . Heart attack Mother       Review of systems complete and found to be  negative unless listed above      PHYSICAL EXAM  General: Well developed, well nourished, in no acute distress HEENT:  Normocephalic and atramatic Neck:  No JVD.  Lungs: Clear bilaterally to auscultation and percussion. Heart: HRRR . Normal S1 and S2 without gallops or murmurs.  Abdomen: Bowel sounds are positive, abdomen soft and non-tender  Msk:  Back normal, normal gait. Normal strength and tone for age. Extremities: No clubbing, cyanosis or edema.   Neuro: Alert and oriented X 3. Psych:  Good affect, responds appropriately  Labs:   Lab Results  Component Value Date   WBC 9.6 01/20/2017   HGB 15.1 01/20/2017   HCT 43.4 01/20/2017   MCV 82.0 01/20/2017   PLT 199 01/20/2017    Recent Labs Lab 01/20/17 1745 01/21/17 0444  NA 138 138  K 3.6 3.6  CL 106 107  CO2 24 26  BUN 24* 20  CREATININE 1.14 0.93  CALCIUM 9.2 8.8*  PROT 7.4  --   BILITOT 0.9  --   ALKPHOS 91  --   ALT 23  --   AST 28  --   GLUCOSE 117* 110*   Lab Results  Component Value Date   CKTOTAL 208 01/20/2017   CKMB 6.7 (H) 01/20/2017   TROPONINI 1.13 (HH) 01/21/2017    Lab Results  Component Value Date   CHOL  142 01/21/2017   CHOL 157 09/08/2008   Lab Results  Component Value Date   HDL 32 (L) 01/21/2017   HDL 33.2 (L) 09/08/2008   Lab Results  Component Value Date   LDLCALC 86 01/21/2017   LDLCALC 107 (H) 09/08/2008   Lab Results  Component Value Date   TRIG 121 01/21/2017   TRIG 85 09/08/2008   Lab Results  Component Value Date   CHOLHDL 4.4 01/21/2017   CHOLHDL 4.7 CALC 09/08/2008   No results found for: LDLDIRECT    Radiology: Dg Chest Port 1 View  Result Date: 01/20/2017 CLINICAL DATA:  61 y/o  M; NSTEMI. EXAM: PORTABLE CHEST 1 VIEW COMPARISON:  None. FINDINGS: The heart size and mediastinal contours are within normal limits. Both lungs are clear. The visualized skeletal structures are unremarkable. IMPRESSION: No active disease. Electronically Signed   By: Mitzi HansenLance   Furusawa-Stratton M.D.   On: 01/20/2017 18:17    EKG: Sinus rhythm with T-wave inversions anterolaterally  ASSESSMENT AND PLAN:   1. New-onset chest pain, 3 days ago, without recurrence, with elevated troponin, and abnormal ECG, consistent with non-ST elevation myocardial infarction. Patient currently chest pain-free, clinically and hemodynamically stable.  Recommendations  1. Agree with current therapy 2. If patient has recurrent chest pain then start full dose anticoagulation 3. 2-D echocardiogram 4. Cardiac catheterization with selective coronary arteriography scheduled for 3//2018. The risks, benefits alternatives were explained to the patient and informed written consent was obtained.  Signed: Marcina MillardAlexander Teancum Brule MD,PhD, [facility] Center[facility] 01/21/2017, 8:57 AM

## 2017-01-21 NOTE — Plan of Care (Signed)
Problem: Pain Managment: Goal: General experience of comfort will improve Outcome: Progressing Prn medications, no further chest pain  Problem: Tissue Perfusion: Goal: Risk factors for ineffective tissue perfusion will decrease Outcome: Progressing SQ Lovenox  Problem: Cardiac: Goal: Ability to achieve and maintain adequate cardiovascular perfusion will improve Outcome: Progressing Orders for echocardiogram

## 2017-01-21 NOTE — Progress Notes (Signed)
Back from echo

## 2017-01-21 NOTE — Progress Notes (Signed)
[employer] - [place] at [facility]   PATIENT NAME: Maurice Rios    MR#:  [id number]  DATE OF BIRTH:  17-Jan-1956  SUBJECTIVE:   Pt. here due to chest pain and ruled in for a non-ST elevation MI. Currently chest pain-free and stable.  REVIEW OF SYSTEMS:    Review of Systems  Constitutional: Negative for chills and fever.  HENT: Negative for congestion and tinnitus.   Eyes: Negative for blurred vision and double vision.  Respiratory: Negative for cough, shortness of breath and wheezing.   Cardiovascular: Negative for chest pain, orthopnea and PND.  Gastrointestinal: Negative for abdominal pain, diarrhea, nausea and vomiting.  Genitourinary: Negative for dysuria and hematuria.  Neurological: Negative for dizziness, sensory change and focal weakness.  All other systems reviewed and are negative.   Nutrition: Heart Healthy Tolerating Diet: Yes Tolerating PT: Ambulatory     DRUG ALLERGIES:  No Known Allergies  VITALS:  Blood pressure 120/68, pulse 60, temperature 98 F (36.7 C), temperature source Oral, resp. rate 16, height 5\' 6"  (1.676 m), weight 87 kg (191 lb 11.2 oz), SpO2 97 %.  PHYSICAL EXAMINATION:   Physical Exam  GENERAL:  61 y.o.-year-old patient lying in the bed with no acute distress.  EYES: Pupils equal, round, reactive to light and accommodation. No scleral icterus. Extraocular muscles intact.  HEENT: Head atraumatic, normocephalic. Oropharynx and nasopharynx clear.  NECK:  Supple, no jugular venous distention. No thyroid enlargement, no tenderness.  LUNGS: Normal breath sounds bilaterally, no wheezing, rales, rhonchi. No use of accessory muscles of respiration.  CARDIOVASCULAR: S1, S2 normal. No murmurs, rubs, or gallops.  ABDOMEN: Soft, nontender, nondistended. Bowel sounds present. No organomegaly or mass.  EXTREMITIES: No cyanosis, clubbing or edema b/l.    NEUROLOGIC: Cranial nerves II through XII are intact. No focal Motor or sensory  deficits b/l.   PSYCHIATRIC: The patient is alert and oriented x 3.  SKIN: No obvious rash, lesion, or ulcer.    LABORATORY PANEL:   CBC  Recent Labs Lab 01/20/17 1745  WBC 9.6  HGB 15.1  HCT 43.4  PLT 199   ------------------------------------------------------------------------------------------------------------------  Chemistries   Recent Labs Lab 01/20/17 1745 01/21/17 0444  NA 138 138  K 3.6 3.6  CL 106 107  CO2 24 26  GLUCOSE 117* 110*  BUN 24* 20  CREATININE 1.14 0.93  CALCIUM 9.2 8.8*  AST 28  --   ALT 23  --   ALKPHOS 91  --   BILITOT 0.9  --    ------------------------------------------------------------------------------------------------------------------  Cardiac Enzymes  Recent Labs Lab 01/21/17 1047  TROPONINI 1.04*   ------------------------------------------------------------------------------------------------------------------  RADIOLOGY:  Dg Chest Port 1 View  Result Date: 01/20/2017 CLINICAL DATA:  61 y/o  M; NSTEMI. EXAM: PORTABLE CHEST 1 VIEW COMPARISON:  None. FINDINGS: The heart size and mediastinal contours are within normal limits. Both lungs are clear. The visualized skeletal structures are unremarkable. IMPRESSION: No active disease. Electronically Signed   By: Mitzi Hansen M.D.   On: 01/20/2017 18:17     ASSESSMENT AND PLAN:   61 year old male with past medical history of osteoarthritis who presents to the hospital with chest pain and ruled in for a non-ST elevation MI.  1. Non-ST elevation MI-patient has ruled in by cardiac markers. His troponins peaked as high as 1.9. -Currently chest pain-free and hemodynamically stable. Seen by cardiology and plan for cardiac catheterization on Monday. -Continue aspirin, statin, beta blocker. Oxygen, morphine, nitroglycerin.    All the records are  reviewed and case discussed with Care Management/Social Worker. Management plans discussed with the patient, family and they  are in agreement.  CODE STATUS: Full code  DVT Prophylaxis: Lovenox  TOTAL TIME TAKING CARE OF THIS PATIENT: 25 minutes.   POSSIBLE D/C IN 2-3 DAYS, DEPENDING ON CLINICAL CONDITION.   [place] SirenSAINANI,VIVEK J M.D on 01/21/2017 at 12:20 PM  Between 7am to 6pm - Pager - [phone]  After 6pm go to [email or web address] - Social research officer, governmentpassword EPAS [facility]  [employer] [place] [facility]  Office  [phone]  CC: Primary care physician; Dorothey BasemanAVID BRONSTEIN, MD

## 2017-01-21 NOTE — Progress Notes (Signed)
To echo via bed.

## 2017-01-21 NOTE — Progress Notes (Signed)
A&O. Independent. Admitted from home via the ED for chest pain. No pain now. IV fluids infusing. Troponins positive. Cardiology consult.

## 2017-01-22 LAB — HEMOGLOBIN A1C
Hgb A1c MFr Bld: 5.6 % (ref 4.8–5.6)
Mean Plasma Glucose: 114 mg/dL

## 2017-01-22 LAB — HIV ANTIBODY (ROUTINE TESTING W REFLEX): HIV Screen 4th Generation wRfx: NONREACTIVE

## 2017-01-22 NOTE — Progress Notes (Signed)
[employer] - [place] at [facility]   PATIENT NAME: Maurice Rios    MR#:  [id number]  DATE OF BIRTH:  07/26/1956  SUBJECTIVE:   Pt. here due to chest pain and ruled in for a non-ST elevation MI. No events overnight. Going for cardiac cath tomorrow.   REVIEW OF SYSTEMS:    Review of Systems  Constitutional: Negative for chills and fever.  HENT: Negative for congestion and tinnitus.   Eyes: Negative for blurred vision and double vision.  Respiratory: Negative for cough, shortness of breath and wheezing.   Cardiovascular: Negative for chest pain, orthopnea and PND.  Gastrointestinal: Negative for abdominal pain, diarrhea, nausea and vomiting.  Genitourinary: Negative for dysuria and hematuria.  Neurological: Negative for dizziness, sensory change and focal weakness.  All other systems reviewed and are negative.   Nutrition: Heart Healthy Tolerating Diet: Yes Tolerating PT: Ambulatory     DRUG ALLERGIES:  No Known Allergies  VITALS:  Blood pressure 124/74, pulse 65, temperature 97.8 F (36.6 C), temperature source Oral, resp. rate 14, height 5\' 6"  (1.676 m), weight 87 kg (191 lb 11.2 oz), SpO2 96 %.  PHYSICAL EXAMINATION:   Physical Exam  GENERAL:  61 y.o.-year-old patient lying in the bed with no acute distress.  EYES: Pupils equal, round, reactive to light and accommodation. No scleral icterus. Extraocular muscles intact.  HEENT: Head atraumatic, normocephalic. Oropharynx and nasopharynx clear.  NECK:  Supple, no jugular venous distention. No thyroid enlargement, no tenderness.  LUNGS: Normal breath sounds bilaterally, no wheezing, rales, rhonchi. No use of accessory muscles of respiration.  CARDIOVASCULAR: S1, S2 normal. No murmurs, rubs, or gallops.  ABDOMEN: Soft, nontender, nondistended. Bowel sounds present. No organomegaly or mass.  EXTREMITIES: No cyanosis, clubbing or edema b/l.    NEUROLOGIC: Cranial nerves II through XII are intact. No  focal Motor or sensory deficits b/l.   PSYCHIATRIC: The patient is alert and oriented x 3.  SKIN: No obvious rash, lesion, or ulcer.    LABORATORY PANEL:   CBC  Recent Labs Lab 01/20/17 1745  WBC 9.6  HGB 15.1  HCT 43.4  PLT 199   ------------------------------------------------------------------------------------------------------------------  Chemistries   Recent Labs Lab 01/20/17 1745 01/21/17 0444  NA 138 138  K 3.6 3.6  CL 106 107  CO2 24 26  GLUCOSE 117* 110*  BUN 24* 20  CREATININE 1.14 0.93  CALCIUM 9.2 8.8*  AST 28  --   ALT 23  --   ALKPHOS 91  --   BILITOT 0.9  --    ------------------------------------------------------------------------------------------------------------------  Cardiac Enzymes  Recent Labs Lab 01/21/17 1047  TROPONINI 1.04*   ------------------------------------------------------------------------------------------------------------------  RADIOLOGY:  Dg Chest Port 1 View  Result Date: 01/20/2017 CLINICAL DATA:  61 y/o  M; NSTEMI. EXAM: PORTABLE CHEST 1 VIEW COMPARISON:  None. FINDINGS: The heart size and mediastinal contours are within normal limits. Both lungs are clear. The visualized skeletal structures are unremarkable. IMPRESSION: No active disease. Electronically Signed   By: Mitzi Hansen M.D.   On: 01/20/2017 18:17     ASSESSMENT AND PLAN:   61 year old male with past medical history of osteoarthritis who presents to the hospital with chest pain and ruled in for a non-ST elevation MI.  1. Non-ST elevation MI-patient has ruled in by cardiac markers. His troponins peaked as high as 1.7. -Currently chest pain-free and hemodynamically stable. Seen by cardiology and plan for cardiac catheterization on Monday. -Continue aspirin, statin, beta blocker. Oxygen, morphine, nitroglycerin.   All  the records are reviewed and case discussed with Care Management/Social Worker. Management plans discussed with the  patient, family and they are in agreement.  CODE STATUS: Full code  DVT Prophylaxis: Lovenox  TOTAL TIME TAKING CARE OF THIS PATIENT: 25 minutes.   POSSIBLE D/C IN 1-2 DAYS, DEPENDING ON CLINICAL CONDITION.   [place] SirenSAINANI,VIVEK J M.D on 01/22/2017 at 12:27 PM  Between 7am to 6pm - Pager - [phone]  After 6pm go to [email or web address] - Social research officer, governmentpassword EPAS [facility]  [employer] [place] [facility]  Office  [phone]  CC: Primary care physician; Dorothey BasemanAVID BRONSTEIN, MD

## 2017-01-22 NOTE — Progress Notes (Signed)
[facility]  SUBJECTIVE: I don't have chest pain   Vitals:   01/21/17 1153 01/21/17 2030 01/22/17 0523 01/22/17 0706  BP: 120/68 119/66 112/64 107/74  Pulse: 60 75 63 68  Resp:  18 18 14   Temp: 98 F (36.7 C) 97.4 F (36.3 C) 98.4 F (36.9 C) 98.2 F (36.8 C)  TempSrc: Oral Oral Oral Oral  SpO2: 97% 95% 96% 96%  Weight:      Height:         Intake/Output Summary (Last 24 hours) at 01/22/17 1020 Last data filed at 01/22/17 0900  Gross per 24 hour  Intake          1001.25 ml  Output             2400 ml  Net         -1398.75 ml      PHYSICAL EXAM  General: Well developed, well nourished, in no acute distress HEENT:  Normocephalic and atramatic Neck:  No JVD.  Lungs: Clear bilaterally to auscultation and percussion. Heart: HRRR . Normal S1 and S2 without gallops or murmurs.  Abdomen: Bowel sounds are positive, abdomen soft and non-tender  Msk:  Back normal, normal gait. Normal strength and tone for age. Extremities: No clubbing, cyanosis or edema.   Neuro: Alert and oriented X 3. Psych:  Good affect, responds appropriately   LABS: Basic Metabolic Panel:  Recent Labs  95/28/4102/01/08 1745 01/21/17 0444  NA 138 138  K 3.6 3.6  CL 106 107  CO2 24 26  GLUCOSE 117* 110*  BUN 24* 20  CREATININE 1.14 0.93  CALCIUM 9.2 8.8*   Liver Function Tests:  Recent Labs  01/20/17 1745  AST 28  ALT 23  ALKPHOS 91  BILITOT 0.9  PROT 7.4  ALBUMIN 4.5   No results for input(s): LIPASE, AMYLASE in the last 72 hours. CBC:  Recent Labs  01/20/17 1745  WBC 9.6  NEUTROABS 7.3*  HGB 15.1  HCT 43.4  MCV 82.0  PLT 199   Cardiac Enzymes:  Recent Labs  01/20/17 1530  01/21/17 0050 01/21/17 0444 01/21/17 1047  CKTOTAL 208  --   --   --   --   CKMB 6.7*  --   --   --   --   TROPONINI 1.76*  < > 1.29* 1.13* 1.04*  < > = values in this interval not displayed. BNP: Invalid input(s): POCBNP D-Dimer: No results for input(s): DDIMER in the last 72  hours. Hemoglobin A1C: No results for input(s): HGBA1C in the last 72 hours. Fasting Lipid Panel:  Recent Labs  01/21/17 0444  CHOL 142  HDL 32*  LDLCALC 86  TRIG 324121  CHOLHDL 4.4   Thyroid Function Tests: No results for input(s): TSH, T4TOTAL, T3FREE, THYROIDAB in the last 72 hours.  Invalid input(s): FREET3 Anemia Panel: No results for input(s): VITAMINB12, FOLATE, FERRITIN, TIBC, IRON, RETICCTPCT in the last 72 hours.  Dg Chest Port 1 View  Result Date: 01/20/2017 CLINICAL DATA:  61 y/o  M; NSTEMI. EXAM: PORTABLE CHEST 1 VIEW COMPARISON:  None. FINDINGS: The heart size and mediastinal contours are within normal limits. Both lungs are clear. The visualized skeletal structures are unremarkable. IMPRESSION: No active disease. Electronically Signed   By: Mitzi HansenLance  Furusawa-Stratton M.D.   On: 01/20/2017 18:17     Echo LV EF 55-60%  TELEMETRY: Sinus rhythm:  ASSESSMENT AND PLAN:  Active Problems:   NSTEMI (non-ST elevated myocardial infarction) (HCC)    1.  NSTEMI, no recurrent chest pain, normal left ventricular function by 2-D echocardiogram  Recommendations  1. Continue current therapy 2. Cardiac catheterization with selective coronary arteriography 43/03/2017. The risks, benefits, and alternatives were explained to the patient and informed consent was obtained.   Marcina Millard, MD, PhD, [facility] 01/22/2017 10:20 AM

## 2017-01-22 NOTE — Plan of Care (Signed)
Problem: Pain Managment: Goal: General experience of comfort will improve Outcome: Progressing Prn medications  Problem: Tissue Perfusion: Goal: Risk factors for ineffective tissue perfusion will decrease Outcome: Progressing SQ Lovenox   Problem: Activity: Goal: Risk for activity intolerance will decrease Outcome: Progressing Ambulated in hallway without pain or shortness of breath  Problem: Cardiac: Goal: Ability to achieve and maintain adequate cardiovascular perfusion will improve Outcome: Progressing No chest pain, scheduled for cardiac cath Monday

## 2017-01-23 ENCOUNTER — Encounter
Admission: EM | Disposition: A | Discharge status: Home / Self Care | Payer: Self-pay | Source: Home / Self Care | Attending: Specialist

## 2017-01-23 ENCOUNTER — Encounter: Payer: Self-pay | Admitting: Cardiology

## 2017-01-23 HISTORY — PX: CORONARY STENT INTERVENTION: CATH118234

## 2017-01-23 HISTORY — PX: LEFT HEART CATH AND CORONARY ANGIOGRAPHY: CATH118249

## 2017-01-23 LAB — POCT ACTIVATED CLOTTING TIME: Activated Clotting Time: 356 seconds

## 2017-01-23 SURGERY — LEFT HEART CATH AND CORONARY ANGIOGRAPHY
Anesthesia: Moderate Sedation

## 2017-01-23 MED ORDER — MIDAZOLAM HCL 2 MG/2ML IJ SOLN
INTRAMUSCULAR | Status: AC
  Filled 2017-01-23: qty 2

## 2017-01-23 MED ORDER — SODIUM CHLORIDE 0.9% FLUSH: 3.0000 mL | INTRAVENOUS | Status: DC | PRN

## 2017-01-23 MED ORDER — ACETAMINOPHEN 325 MG PO TABS: 650.0000 mg | ORAL_TABLET | ORAL | Status: DC | PRN

## 2017-01-23 MED ORDER — FENTANYL CITRATE (PF) 100 MCG/2ML IJ SOLN
INTRAMUSCULAR | Status: AC
  Filled 2017-01-23: qty 2

## 2017-01-23 MED ORDER — BIVALIRUDIN BOLUS VIA INFUSION - CUPID
INTRAVENOUS | Status: DC | PRN
  Administered 2017-01-23: 64.95 mg via INTRAVENOUS

## 2017-01-23 MED ORDER — SODIUM CHLORIDE 0.9 % IV BOLUS (SEPSIS)
500.0000 mL | Freq: Once | INTRAVENOUS | Status: AC
  Administered 2017-01-23: 500 mL via INTRAVENOUS

## 2017-01-23 MED ORDER — NITROGLYCERIN 5 MG/ML IV SOLN
INTRAVENOUS | Status: AC
  Filled 2017-01-23: qty 10

## 2017-01-23 MED ORDER — LABETALOL HCL 5 MG/ML IV SOLN: 10.0000 mg | INTRAVENOUS | Status: AC | PRN

## 2017-01-23 MED ORDER — MIDAZOLAM HCL 2 MG/2ML IJ SOLN
INTRAMUSCULAR | Status: DC | PRN
  Administered 2017-01-23: 1 mg via INTRAVENOUS

## 2017-01-23 MED ORDER — SODIUM CHLORIDE 0.9 % IV SOLN
250.0000 mL | INTRAVENOUS | Status: DC | PRN
Start: 2017-01-23 — End: 2017-01-24

## 2017-01-23 MED ORDER — ATROPINE SULFATE 1 MG/10ML IJ SOSY
1.0000 mg | PREFILLED_SYRINGE | Freq: Once | INTRAMUSCULAR | Status: AC
  Administered 2017-01-23: 1 mg via INTRAVENOUS

## 2017-01-23 MED ORDER — SODIUM CHLORIDE 0.9 % WEIGHT BASED INFUSION
1.0000 mL/kg/h | INTRAVENOUS | Status: AC
  Administered 2017-01-23: 1 mL/kg/h via INTRAVENOUS

## 2017-01-23 MED ORDER — TIROFIBAN HCL IN NACL 5-0.9 MG/100ML-% IV SOLN
INTRAVENOUS | Status: AC
  Filled 2017-01-23: qty 100

## 2017-01-23 MED ORDER — TICAGRELOR 90 MG PO TABS
ORAL_TABLET | ORAL | Status: AC
  Filled 2017-01-23: qty 2

## 2017-01-23 MED ORDER — SODIUM CHLORIDE 0.9 % IV SOLN
INTRAVENOUS | Status: DC | PRN
  Administered 2017-01-23: 1.75 mg/kg/h via INTRAVENOUS

## 2017-01-23 MED ORDER — BIVALIRUDIN 250 MG IV SOLR
INTRAVENOUS | Status: AC
  Filled 2017-01-23: qty 250

## 2017-01-23 MED ORDER — SODIUM CHLORIDE 0.9% FLUSH
3.0000 mL | Freq: Two times a day (BID) | INTRAVENOUS | Status: DC
  Administered 2017-01-24: 3 mL via INTRAVENOUS

## 2017-01-23 MED ORDER — ONDANSETRON HCL 4 MG/2ML IJ SOLN
INTRAMUSCULAR | Status: AC
  Administered 2017-01-23: 4 mg via INTRAVENOUS
  Filled 2017-01-23: qty 2

## 2017-01-23 MED ORDER — ASPIRIN 81 MG PO CHEW
CHEWABLE_TABLET | ORAL | Status: DC | PRN
  Administered 2017-01-23: 243 mg via ORAL

## 2017-01-23 MED ORDER — ONDANSETRON HCL 4 MG/2ML IJ SOLN
4.0000 mg | Freq: Four times a day (QID) | INTRAMUSCULAR | Status: DC | PRN
  Administered 2017-01-23: 4 mg via INTRAVENOUS

## 2017-01-23 MED ORDER — FENTANYL CITRATE (PF) 100 MCG/2ML IJ SOLN
INTRAMUSCULAR | Status: DC | PRN
  Administered 2017-01-23: 25 ug via INTRAVENOUS

## 2017-01-23 MED ORDER — POLYETHYLENE GLYCOL 3350 17 G PO PACK
17.0000 g | PACK | Freq: Every day | ORAL | Status: DC
  Administered 2017-01-23 – 2017-01-24 (×2): 17 g via ORAL
  Filled 2017-01-23 (×2): qty 1

## 2017-01-23 MED ORDER — HEPARIN (PORCINE) IN NACL 2-0.9 UNIT/ML-% IJ SOLN
INTRAMUSCULAR | Status: AC
Start: 2017-01-23 — End: 2017-01-23
  Filled 2017-01-23: qty 500

## 2017-01-23 MED ORDER — ATROPINE SULFATE 1 MG/10ML IJ SOSY
PREFILLED_SYRINGE | INTRAMUSCULAR | Status: AC
  Administered 2017-01-23: 1 mg
  Filled 2017-01-23: qty 10

## 2017-01-23 MED ORDER — TICAGRELOR 90 MG PO TABS
90.0000 mg | ORAL_TABLET | Freq: Two times a day (BID) | ORAL | Status: DC
  Administered 2017-01-23 – 2017-01-24 (×2): 90 mg via ORAL
  Filled 2017-01-23 (×2): qty 1

## 2017-01-23 MED ORDER — HYDRALAZINE HCL 20 MG/ML IJ SOLN: 5.0000 mg | INTRAMUSCULAR | Status: AC | PRN

## 2017-01-23 MED ORDER — DOCUSATE SODIUM 100 MG PO CAPS
100.0000 mg | ORAL_CAPSULE | Freq: Two times a day (BID) | ORAL | Status: DC
Start: 2017-01-23 — End: 2017-01-24
  Administered 2017-01-23 – 2017-01-24 (×3): 100 mg via ORAL
  Filled 2017-01-23 (×3): qty 1

## 2017-01-23 MED ORDER — TICAGRELOR 90 MG PO TABS
ORAL_TABLET | ORAL | Status: DC | PRN
  Administered 2017-01-23: 180 mg via ORAL

## 2017-01-23 MED ORDER — TIROFIBAN HCL IN NACL 5-0.9 MG/100ML-% IV SOLN
0.1500 ug/kg/min | INTRAVENOUS | Status: AC
  Administered 2017-01-23 (×2): 0.15 ug/kg/min via INTRAVENOUS
  Filled 2017-01-23 (×4): qty 100

## 2017-01-23 MED ORDER — NITROGLYCERIN 1 MG/10 ML FOR IR/CATH LAB
INTRA_ARTERIAL | Status: DC | PRN
  Administered 2017-01-23: 200 ug via INTRACORONARY

## 2017-01-23 MED ORDER — TIROFIBAN (AGGRASTAT) BOLUS VIA INFUSION
INTRAVENOUS | Status: DC | PRN
  Administered 2017-01-23: 2165 ug via INTRAVENOUS

## 2017-01-23 MED ORDER — TIROFIBAN HCL IN NACL 5-0.9 MG/100ML-% IV SOLN
INTRAVENOUS | Status: DC | PRN
  Administered 2017-01-23: 0.15 ug/kg/min via INTRAVENOUS

## 2017-01-23 MED ORDER — ASPIRIN 81 MG PO CHEW
CHEWABLE_TABLET | ORAL | Status: AC
  Filled 2017-01-23: qty 3

## 2017-01-23 MED ORDER — SENNA 8.6 MG PO TABS
1.0000 | ORAL_TABLET | Freq: Every day | ORAL | Status: DC
  Administered 2017-01-23 – 2017-01-24 (×2): 8.6 mg via ORAL
  Filled 2017-01-23 (×2): qty 1

## 2017-01-23 MED ORDER — ASPIRIN EC 81 MG PO TBEC
81.0000 mg | DELAYED_RELEASE_TABLET | Freq: Every day | ORAL | Status: DC
  Administered 2017-01-24: 81 mg via ORAL
  Filled 2017-01-23: qty 1

## 2017-01-23 MED ORDER — IOPAMIDOL (ISOVUE-300) INJECTION 61%
INTRAVENOUS | Status: DC | PRN
  Administered 2017-01-23: 235 mL via INTRA_ARTERIAL

## 2017-01-23 SURGICAL SUPPLY — 16 items
BALLN TREK RX 2.25X12 (BALLOONS) ×3
BALLOON TREK RX 2.25X12 (BALLOONS) ×1 IMPLANT
CATH INFINITI 5FR ANG PIGTAIL (CATHETERS) ×3 IMPLANT
CATH INFINITI 5FR JL4 (CATHETERS) ×3 IMPLANT
CATH INFINITI JR4 5F (CATHETERS) ×3 IMPLANT
CATH VISTA GUIDE 6FR XB3.5 SH (CATHETERS) ×3 IMPLANT
DEVICE CLOSURE MYNXGRIP 6/7F (Vascular Products) ×3 IMPLANT
DEVICE INFLAT 30 PLUS (MISCELLANEOUS) ×3 IMPLANT
KIT MANI 3VAL PERCEP (MISCELLANEOUS) ×3 IMPLANT
NEEDLE PERC 18GX7CM (NEEDLE) ×3 IMPLANT
PACK CARDIAC CATH (CUSTOM PROCEDURE TRAY) ×3 IMPLANT
SHEATH AVANTI 5FR X 11CM (SHEATH) ×3 IMPLANT
SHEATH AVANTI 6FR X 11CM (SHEATH) ×3 IMPLANT
STENT XIENCE ALPINE RX 3.0X15 (Permanent Stent) ×3 IMPLANT
WIRE ASAHI PROWATER 180CM (WIRE) ×3 IMPLANT
WIRE EMERALD 3MM-J .035X150CM (WIRE) ×3 IMPLANT

## 2017-01-23 NOTE — Progress Notes (Addendum)
At 1505 hours, this patient was standing by his bed. He had just used the urinal, and I was changing his gown so he could ambulate in the hallway. He started breathing heavy, became diaphoretic, stated he was dizzy, and legs began to tremor. I was able to turn the patient around and assist him back to the bed in a supine position. I immediately took his vitals. BP was 108/59, HR 58. After approximately 5 minutes, the patient advised he was feeling better, dizziness had subsided, and his skin was dry, still cool to touch. I paged Dr. Juliene PinaMody and advised her of this near syncopal type episode. Same gave a verbal order of 500 mL 0.9% NS fluid bolus. I will administer this over 1 hour and continue to monitor this patient. 2 top bed rails and 1 lower bed rail up, call bell, phone, and urinal all within reach, and patient advised not to get out of bed without calling for and waiting for the arrival of nursing staff. He verbalized his understanding of this instruction. Will continue to monitor this patient.  Addendum - At 1822 hours, BP 112/26, HR 83. Skin warm & dry. Patient states that he feels much better than he did when he had earlier episode. Patient ambulated w/o difficulty/incident, w/ this writer's standby assist, to the bathroom to attempt BM (no results). Gown changed, assisted back to bed, and reminded not to get up w/o calling for assistance to be present. Will continue to monitor this patient.

## 2017-01-23 NOTE — Progress Notes (Signed)
[place], [place]. This patient has just returned from the cath lab. He had 1 drug eluding stent placed in his LAD, received 180 of Brilinta, Aggrastat running at 0.15 mcg/kg/min (to be d/c'd at 0230 01/24/2017, per cath lab). Patient had vagal episode after procedure - was given 1 mg Atropine, 500 mL 0.9% NS bolus, and Zofran. BP 99/63 upon arrival to room 258, NSR, skin warm & dry, A&O x 4. Right groin is Level 0.

## 2017-01-23 NOTE — Progress Notes (Signed)
[employer] - [place] at [facility]   PATIENT NAME: Maurice Rios    MR#:  [id number]  DATE OF BIRTH:  1956-09-16  SUBJECTIVE:   Patient underwent cardiac catheterization. After stent was placed head low heart rate in attached pain is given.  REVIEW OF SYSTEMS:    Review of Systems  Constitutional: Negative for chills and fever.  HENT: Negative for congestion and tinnitus.   Eyes: Negative for blurred vision and double vision.  Respiratory: Negative for cough, shortness of breath and wheezing.   Cardiovascular: Negative for chest pain, orthopnea and PND.  Gastrointestinal: Negative for abdominal pain, diarrhea, nausea and vomiting.  Genitourinary: Negative for dysuria and hematuria.  Neurological: Negative for dizziness, sensory change and focal weakness.  All other systems reviewed and are negative.   Nutrition: Heart Healthy Tolerating Diet: Yes Tolerating PT: Ambulatory     DRUG ALLERGIES:  No Known Allergies  VITALS:  Blood pressure 99/63, pulse 76, temperature 97.9 F (36.6 C), temperature source Oral, resp. rate 12, height 5\' 6"  (1.676 m), weight 86.6 kg (191 lb), SpO2 98 %.  PHYSICAL EXAMINATION:   Physical Exam  GENERAL:  61 y.o.-year-old patient lying in the bed with no acute distress.  EYES: Pupils equal, round, reactive to light and accommodation. No scleral icterus. Extraocular muscles intact.  HEENT: Head atraumatic, normocephalic. Oropharynx and nasopharynx clear.  NECK:  Supple, no jugular venous distention. No thyroid enlargement, no tenderness.  LUNGS: Normal breath sounds bilaterally, no wheezing, rales, rhonchi. No use of accessory muscles of respiration.  CARDIOVASCULAR: S1, S2 normal. No murmurs, rubs, or gallops.  ABDOMEN: Soft, nontender, nondistended. Bowel sounds present. No organomegaly or mass.  Groin site without hematoma EXTREMITIES: No cyanosis, clubbing or edema b/l.    NEUROLOGIC: Cranial nerves II through XII are  intact. No focal Motor or sensory deficits b/l.   PSYCHIATRIC: The patient is alert and oriented x 3.  SKIN: No obvious rash, lesion, or ulcer.    LABORATORY PANEL:   CBC  Recent Labs Lab 01/20/17 1745  WBC 9.6  HGB 15.1  HCT 43.4  PLT 199   ------------------------------------------------------------------------------------------------------------------  Chemistries   Recent Labs Lab 01/20/17 1745 01/21/17 0444  NA 138 138  K 3.6 3.6  CL 106 107  CO2 24 26  GLUCOSE 117* 110*  BUN 24* 20  CREATININE 1.14 0.93  CALCIUM 9.2 8.8*  AST 28  --   ALT 23  --   ALKPHOS 91  --   BILITOT 0.9  --    ------------------------------------------------------------------------------------------------------------------  Cardiac Enzymes  Recent Labs Lab 01/21/17 1047  TROPONINI 1.04*   ------------------------------------------------------------------------------------------------------------------  RADIOLOGY:  No results found.   ASSESSMENT AND PLAN:   61 year old male with past medical history of osteoarthritis who presents to the hospital with chest pain and ruled in for a non-ST elevation MI.  1. Non-ST elevation MI: Patient underwent cardiac catheterization and placement of drug-eluting stent to mid LAD Continue aspirin, atorvastatin, metoprolol, nitroglycerin sublingual when necessary and Brillanta. 2. Constipation: Start laxatives  All the records are reviewed and case discussed with Care Management/Social Worker. Management plans discussed with the patient, family and they are in agreement.  CODE STATUS: Full code  DVT Prophylaxis: Lovenox  TOTAL TIME TAKING CARE OF THIS PATIENT: 27 minutes.   POSSIBLE D/C IN tomorrw, DEPENDING ON CLINICAL CONDITION.   Kaushik Maul M.D on 01/23/2017 at 11:47 AM  Between 7am to 6pm - Pager - [phone]  After 6pm go to [email or web address] - password  EPAS [facility]  [employer] [place] [facility]  Office   [phone]  CC: Primary care physician; Dorothey Baseman, MD

## 2017-01-23 NOTE — Care Management (Signed)
RNCM rounded on patient this AM but patient was on unit during that time. This writer's contact information placed on patient's white board in room.

## 2017-01-24 ENCOUNTER — Inpatient Hospital Stay: Payer: BLUE CROSS/BLUE SHIELD

## 2017-01-24 LAB — CBC
HCT: 34.9 % — ABNORMAL LOW (ref 40.0–52.0)
Hemoglobin: 12.1 g/dL — ABNORMAL LOW (ref 13.0–18.0)
MCH: 28.7 pg (ref 26.0–34.0)
MCHC: 34.6 g/dL (ref 32.0–36.0)
MCV: 82.8 fL (ref 80.0–100.0)
PLATELETS: 190 10*3/uL (ref 150–440)
RBC: 4.21 MIL/uL — AB (ref 4.40–5.90)
RDW: 15.2 % — ABNORMAL HIGH (ref 11.5–14.5)
WBC: 12.1 10*3/uL — AB (ref 3.8–10.6)

## 2017-01-24 LAB — URINALYSIS, ROUTINE W REFLEX MICROSCOPIC
BACTERIA UA: NONE SEEN
BILIRUBIN URINE: NEGATIVE
Glucose, UA: NEGATIVE mg/dL
HGB URINE DIPSTICK: NEGATIVE
Ketones, ur: NEGATIVE mg/dL
NITRITE: NEGATIVE
PROTEIN: NEGATIVE mg/dL
Specific Gravity, Urine: 1.027 (ref 1.005–1.030)
pH: 5 (ref 5.0–8.0)

## 2017-01-24 LAB — BASIC METABOLIC PANEL
ANION GAP: 4 — AB (ref 5–15)
BUN: 20 mg/dL (ref 6–20)
CALCIUM: 8.9 mg/dL (ref 8.9–10.3)
CO2: 26 mmol/L (ref 22–32)
Chloride: 111 mmol/L (ref 101–111)
Creatinine, Ser: 1.09 mg/dL (ref 0.61–1.24)
Glucose, Bld: 123 mg/dL — ABNORMAL HIGH (ref 65–99)
Potassium: 4.3 mmol/L (ref 3.5–5.1)
SODIUM: 141 mmol/L (ref 135–145)

## 2017-01-24 MED ORDER — FLEET ENEMA 7-19 GM/118ML RE ENEM: 1.0000 | ENEMA | Freq: Once | RECTAL | Status: DC

## 2017-01-24 MED ORDER — CEPHALEXIN 500 MG PO CAPS: 500.0000 mg | ORAL_CAPSULE | Freq: Three times a day (TID) | ORAL | 0 refills | Status: AC

## 2017-01-24 MED ORDER — BISACODYL 10 MG RE SUPP: 10.0000 mg | Freq: Every day | RECTAL | Status: DC

## 2017-01-24 MED ORDER — ASPIRIN 81 MG PO TBEC: 81.0000 mg | DELAYED_RELEASE_TABLET | Freq: Every day | ORAL | 0 refills | Status: AC

## 2017-01-24 MED ORDER — NITROGLYCERIN 0.4 MG SL SUBL: 0.4000 mg | SUBLINGUAL_TABLET | SUBLINGUAL | 0 refills | Status: AC | PRN

## 2017-01-24 MED ORDER — ATORVASTATIN CALCIUM 40 MG PO TABS: 40.0000 mg | ORAL_TABLET | Freq: Every day | ORAL | 0 refills | Status: AC

## 2017-01-24 MED ORDER — SENNOSIDES-DOCUSATE SODIUM 8.6-50 MG PO TABS: 1.0000 | ORAL_TABLET | Freq: Every evening | ORAL | 0 refills | Status: AC | PRN

## 2017-01-24 MED ORDER — TICAGRELOR 90 MG PO TABS: 90.0000 mg | ORAL_TABLET | Freq: Two times a day (BID) | ORAL | 0 refills | Status: AC

## 2017-01-24 MED ORDER — TICAGRELOR 90 MG PO TABS: 90.0000 mg | ORAL_TABLET | Freq: Two times a day (BID) | ORAL | 0 refills | Status: DC

## 2017-01-24 MED ORDER — POLYETHYLENE GLYCOL 3350 17 G PO PACK: 17.0000 g | PACK | Freq: Every day | ORAL | 0 refills | Status: AC

## 2017-01-24 MED ORDER — METOPROLOL TARTRATE 25 MG PO TABS: 12.5000 mg | ORAL_TABLET | Freq: Two times a day (BID) | ORAL | 0 refills | Status: AC

## 2017-01-24 NOTE — Discharge Summary (Signed)
[facility] - [place] at [facility]   PATIENT NAME: Maurice Rios    MR#:  [id number]  DATE OF BIRTH:  January 16, 1956  DATE OF ADMISSION:  01/20/2017 ADMITTING PHYSICIAN: Shaune Pollack, MD  DATE OF DISCHARGE: 01/24/2017  PRIMARY CARE PHYSICIAN: Dorothey Baseman, MD    ADMISSION DIAGNOSIS:  NSTEMI (non-ST elevated myocardial infarction) (HCC) [I21.4]  DISCHARGE DIAGNOSIS:  Active Problems:   NSTEMI (non-ST elevated myocardial infarction) (HCC)   SECONDARY DIAGNOSIS:   Past Medical History:  Diagnosis Date  . Arthritis   . Depression   . Impaired fasting glucose     HOSPITAL COURSE:   61 year old male with past medical history of osteoarthritis who presents to the hospital with chest pain and ruled in for a non-ST elevation MI.  1. Non-ST elevation MI: Patient underwent cardiac catheterization and placement of drug-eluting stent to mid LAD He will continue aspirin, atorvastatin, metoprolol, nitroglycerin sublingual when necessary and Brillanta. He Has follow-up with cardiology in 1 week.  2. Constipation: He needs to continue laxatives. He also should see a GI specialist as he has had history of constipation.  DISCHARGE CONDITIONS AND DIET:   Stable on cardiac diet  CONSULTS OBTAINED:  Treatment Team:  Marcina Millard, MD  DRUG ALLERGIES:  No Known Allergies  DISCHARGE MEDICATIONS:   Current Discharge Medication List    START taking these medications   Details  aspirin EC 81 MG EC tablet Take 1 tablet (81 mg total) by mouth daily. Qty: 30 tablet, Refills: 0    atorvastatin (LIPITOR) 40 MG tablet Take 1 tablet (40 mg total) by mouth daily at 6 PM. Qty: 30 tablet, Refills: 0    metoprolol tartrate (LOPRESSOR) 25 MG tablet Take 0.5 tablets (12.5 mg total) by mouth 2 (two) times daily. Qty: 60 tablet, Refills: 0    nitroGLYCERIN (NITROSTAT) 0.4 MG SL tablet Place 1 tablet (0.4 mg total) under the tongue every 5 (five) minutes x 3 doses as needed  for chest pain. Qty: 30 tablet, Refills: 0    polyethylene glycol (MIRALAX / GLYCOLAX) packet Take 17 g by mouth daily. Qty: 14 each, Refills: 0    senna-docusate (SENOKOT-S) 8.6-50 MG tablet Take 1 tablet by mouth at bedtime as needed for mild constipation. Qty: 30 tablet, Refills: 0    ticagrelor (BRILINTA) 90 MG TABS tablet Take 1 tablet (90 mg total) by mouth 2 (two) times daily. Qty: 60 tablet, Refills: 0          Today   CHIEF COMPLAINT:  Lower abdominal pain is better this morning   VITAL SIGNS:  Blood pressure 105/62, pulse 73, temperature 99 F (37.2 C), temperature source Oral, resp. rate 18, height 5\' 6"  (1.676 m), weight 86.6 kg (191 lb), SpO2 96 %.   REVIEW OF SYSTEMS:  Review of Systems  Constitutional: Negative.  Negative for chills, fever and malaise/fatigue.  HENT: Negative.  Negative for ear discharge, ear pain, hearing loss, nosebleeds and sore throat.   Eyes: Negative.  Negative for blurred vision and pain.  Respiratory: Negative.  Negative for cough, hemoptysis, shortness of breath and wheezing.   Cardiovascular: Negative.  Negative for chest pain, palpitations and leg swelling.  Gastrointestinal: Positive for constipation. Negative for abdominal pain, blood in stool, diarrhea, nausea and vomiting.  Genitourinary: Negative.  Negative for dysuria.  Musculoskeletal: Negative.  Negative for back pain.  Skin: Negative.   Neurological: Negative for dizziness, tremors, speech change, focal weakness, seizures and headaches.  Endo/Heme/Allergies: Negative.  Does not  bruise/bleed easily.  Psychiatric/Behavioral: Negative.  Negative for depression, hallucinations and suicidal ideas.     PHYSICAL EXAMINATION:  GENERAL:  62 y.o.-year-old patient lying in the bed with no acute distress.  NECK:  Supple, no jugular venous distention. No thyroid enlargement, no tenderness.  LUNGS: Normal breath sounds bilaterally, no wheezing, rales,rhonchi  No use of accessory  muscles of respiration.  CARDIOVASCULAR: S1, S2 normal. No murmurs, rubs, or gallops.  ABDOMEN: Soft, non-tender, non-distended. Bowel sounds present. No organomegaly or mass.  EXTREMITIES: No pedal edema, cyanosis, or clubbing.  PSYCHIATRIC: The patient is alert and oriented x 3.  SKIN: No obvious rash, lesion, or ulcer.   DATA REVIEW:   CBC  Recent Labs Lab 01/24/17 0332  WBC 12.1*  HGB 12.1*  HCT 34.9*  PLT 190    Chemistries   Recent Labs Lab 01/20/17 1745  01/24/17 0332  NA 138  < > 141  K 3.6  < > 4.3  CL 106  < > 111  CO2 24  < > 26  GLUCOSE 117*  < > 123*  BUN 24*  < > 20  CREATININE 1.14  < > 1.09  CALCIUM 9.2  < > 8.9  AST 28  --   --   ALT 23  --   --   ALKPHOS 91  --   --   BILITOT 0.9  --   --   < > = values in this interval not displayed.  Cardiac Enzymes  Recent Labs Lab 01/21/17 0050 01/21/17 0444 01/21/17 1047  TROPONINI 1.29* 1.13* 1.04*    Microbiology Results  @MICRORSLT48 @  RADIOLOGY:  No results found.    Current Discharge Medication List    START taking these medications   Details  aspirin EC 81 MG EC tablet Take 1 tablet (81 mg total) by mouth daily. Qty: 30 tablet, Refills: 0    atorvastatin (LIPITOR) 40 MG tablet Take 1 tablet (40 mg total) by mouth daily at 6 PM. Qty: 30 tablet, Refills: 0    metoprolol tartrate (LOPRESSOR) 25 MG tablet Take 0.5 tablets (12.5 mg total) by mouth 2 (two) times daily. Qty: 60 tablet, Refills: 0    nitroGLYCERIN (NITROSTAT) 0.4 MG SL tablet Place 1 tablet (0.4 mg total) under the tongue every 5 (five) minutes x 3 doses as needed for chest pain. Qty: 30 tablet, Refills: 0    polyethylene glycol (MIRALAX / GLYCOLAX) packet Take 17 g by mouth daily. Qty: 14 each, Refills: 0    senna-docusate (SENOKOT-S) 8.6-50 MG tablet Take 1 tablet by mouth at bedtime as needed for mild constipation. Qty: 30 tablet, Refills: 0    ticagrelor (BRILINTA) 90 MG TABS tablet Take 1 tablet (90 mg total) by  mouth 2 (two) times daily. Qty: 60 tablet, Refills: 0         Aspirin prescribed at discharge?  Yes High Intensity Statin Prescribed? (Lipitor 40-80mg  or Crestor 20-40mg ): Yes Beta Blocker Prescribed? Yes Was Cardiac Rehab II ordered? (Included Medically managed Patients): Yes   Management plans discussed with the patient and he is in agreement. Stable for discharge home Patient should follow up with Dr Evette Georges  CODE STATUS:     Code Status Orders        Start     Ordered   01/20/17 1951  Full code  Continuous     01/20/17 1950    Code Status History    Date Active Date Inactive Code Status Order ID Comments User Context  This patient has a current code status but no historical code status.      TOTAL TIME TAKING CARE OF THIS PATIENT: 37 minutes.    Note: This dictation was prepared with Dragon dictation along with smaller phrase technology. Any transcriptional errors that result from this process are unintentional.  Altheia Shafran M.D on 01/24/2017 at 8:05 AM  Between 7am to 6pm - Pager - [phone] After 6pm go to [email or web address] - Social research officer, governmentpassword EPAS [email or web address]  [facility]  Office  [phone]  CC: Primary care physician; Dorothey BasemanAVID BRONSTEIN, MD

## 2017-01-24 NOTE — Discharge Summary (Signed)
[facility] - [place] at [facility]   PATIENT NAME: Maurice Rios    MR#:  [id number]  DATE OF BIRTH:  08/07/1956  DATE OF ADMISSION:  01/20/2017 ADMITTING PHYSICIAN: Shaune Pollack, MD  DATE OF DISCHARGE: 01/24/2017  PRIMARY CARE PHYSICIAN: Dorothey Baseman, MD    ADMISSION DIAGNOSIS:  NSTEMI (non-ST elevated myocardial infarction) (HCC) [I21.4]  DISCHARGE DIAGNOSIS:  Active Problems:   NSTEMI (non-ST elevated myocardial infarction) (HCC)   SECONDARY DIAGNOSIS:   Past Medical History:  Diagnosis Date  . Arthritis   . Depression   . Impaired fasting glucose     HOSPITAL COURSE:   61 year old male with past medical history of osteoarthritis who presents to the hospital with chest pain and ruled in for a non-ST elevation MI.  1. Non-ST elevation MI: Patient underwent cardiac catheterization and placement of drug-eluting stent to mid LAD He will continue aspirin, atorvastatin, metoprolol, nitroglycerin sublingual when necessary and Brillanta. He Has follow-up with cardiology in 1 week.  2. Constipation: He needs to continue laxatives. He also should see a GI specialist as he has had history of constipation.  3. UTI: Patient will be discharged on Keflex and will need repeat urine analysis in 14 days  DISCHARGE CONDITIONS AND DIET:   Stable on cardiac diet  CONSULTS OBTAINED:  Treatment Team:  Marcina Millard, MD  DRUG ALLERGIES:  No Known Allergies  DISCHARGE MEDICATIONS:   Current Discharge Medication List    START taking these medications   Details  aspirin EC 81 MG EC tablet Take 1 tablet (81 mg total) by mouth daily. Qty: 30 tablet, Refills: 0    atorvastatin (LIPITOR) 40 MG tablet Take 1 tablet (40 mg total) by mouth daily at 6 PM. Qty: 30 tablet, Refills: 0    cephALEXin (KEFLEX) 500 MG capsule Take 1 capsule (500 mg total) by mouth 3 (three) times daily. Qty: 30 capsule, Refills: 0    metoprolol tartrate (LOPRESSOR) 25 MG tablet  Take 0.5 tablets (12.5 mg total) by mouth 2 (two) times daily. Qty: 60 tablet, Refills: 0    nitroGLYCERIN (NITROSTAT) 0.4 MG SL tablet Place 1 tablet (0.4 mg total) under the tongue every 5 (five) minutes x 3 doses as needed for chest pain. Qty: 30 tablet, Refills: 0    polyethylene glycol (MIRALAX / GLYCOLAX) packet Take 17 g by mouth daily. Qty: 14 each, Refills: 0    senna-docusate (SENOKOT-S) 8.6-50 MG tablet Take 1 tablet by mouth at bedtime as needed for mild constipation. Qty: 30 tablet, Refills: 0    ticagrelor (BRILINTA) 90 MG TABS tablet Take 1 tablet (90 mg total) by mouth 2 (two) times daily. Qty: 60 tablet, Refills: 0          Today   CHIEF COMPLAINT:  Lower abdominal pain is better this morning   VITAL SIGNS:  Blood pressure 133/69, pulse 88, temperature 98.3 F (36.8 C), temperature source Oral, resp. rate 18, height 5\' 6"  (1.676 m), weight 86.6 kg (191 lb), SpO2 96 %.   REVIEW OF SYSTEMS:  Review of Systems  Constitutional: Negative.  Negative for chills, fever and malaise/fatigue.  HENT: Negative.  Negative for ear discharge, ear pain, hearing loss, nosebleeds and sore throat.   Eyes: Negative.  Negative for blurred vision and pain.  Respiratory: Negative.  Negative for cough, hemoptysis, shortness of breath and wheezing.   Cardiovascular: Negative.  Negative for chest pain, palpitations and leg swelling.  Gastrointestinal: Positive for constipation. Negative for abdominal pain, blood in  stool, diarrhea, nausea and vomiting.  Genitourinary: Negative.  Negative for dysuria.  Musculoskeletal: Negative.  Negative for back pain.  Skin: Negative.   Neurological: Negative for dizziness, tremors, speech change, focal weakness, seizures and headaches.  Endo/Heme/Allergies: Negative.  Does not bruise/bleed easily.  Psychiatric/Behavioral: Negative.  Negative for depression, hallucinations and suicidal ideas.     PHYSICAL EXAMINATION:  GENERAL:  61  y.o.-year-old patient lying in the bed with no acute distress.  NECK:  Supple, no jugular venous distention. No thyroid enlargement, no tenderness.  LUNGS: Normal breath sounds bilaterally, no wheezing, rales,rhonchi  No use of accessory muscles of respiration.  CARDIOVASCULAR: S1, S2 normal. No murmurs, rubs, or gallops.  ABDOMEN: Soft, non-tender, non-distended. Bowel sounds present. No organomegaly or mass.  EXTREMITIES: No pedal edema, cyanosis, or clubbing.  PSYCHIATRIC: The patient is alert and oriented x 3.  SKIN: No obvious rash, lesion, or ulcer.   DATA REVIEW:   CBC  Recent Labs Lab 01/24/17 0332  WBC 12.1*  HGB 12.1*  HCT 34.9*  PLT 190    Chemistries   Recent Labs Lab 01/20/17 1745  01/24/17 0332  NA 138  < > 141  K 3.6  < > 4.3  CL 106  < > 111  CO2 24  < > 26  GLUCOSE 117*  < > 123*  BUN 24*  < > 20  CREATININE 1.14  < > 1.09  CALCIUM 9.2  < > 8.9  AST 28  --   --   ALT 23  --   --   ALKPHOS 91  --   --   BILITOT 0.9  --   --   < > = values in this interval not displayed.  Cardiac Enzymes  Recent Labs Lab 01/21/17 0050 01/21/17 0444 01/21/17 1047  TROPONINI 1.29* 1.13* 1.04*    Microbiology Results  @MICRORSLT48 @  RADIOLOGY:  Dg Abd 1 View  Result Date: 01/24/2017 CLINICAL DATA:  Recent heart attack, now with lower abdomen pain, no recent bowel movement EXAM: ABDOMEN - 1 VIEW COMPARISON:  CT abdomen and pelvis of 12/10/2008 FINDINGS: There is a moderate to large amount of feces throughout the entire colon. No bowel obstruction is seen. No opaque calculi are noted. No bony abnormality is seen. IMPRESSION: Moderate to large amount of feces throughout the colon. No bowel obstruction. Electronically Signed   By: Dwyane Dee M.D.   On: 01/24/2017 09:17      Current Discharge Medication List    START taking these medications   Details  aspirin EC 81 MG EC tablet Take 1 tablet (81 mg total) by mouth daily. Qty: 30 tablet, Refills: 0     atorvastatin (LIPITOR) 40 MG tablet Take 1 tablet (40 mg total) by mouth daily at 6 PM. Qty: 30 tablet, Refills: 0    cephALEXin (KEFLEX) 500 MG capsule Take 1 capsule (500 mg total) by mouth 3 (three) times daily. Qty: 30 capsule, Refills: 0    metoprolol tartrate (LOPRESSOR) 25 MG tablet Take 0.5 tablets (12.5 mg total) by mouth 2 (two) times daily. Qty: 60 tablet, Refills: 0    nitroGLYCERIN (NITROSTAT) 0.4 MG SL tablet Place 1 tablet (0.4 mg total) under the tongue every 5 (five) minutes x 3 doses as needed for chest pain. Qty: 30 tablet, Refills: 0    polyethylene glycol (MIRALAX / GLYCOLAX) packet Take 17 g by mouth daily. Qty: 14 each, Refills: 0    senna-docusate (SENOKOT-S) 8.6-50 MG tablet Take 1 tablet by  mouth at bedtime as needed for mild constipation. Qty: 30 tablet, Refills: 0    ticagrelor (BRILINTA) 90 MG TABS tablet Take 1 tablet (90 mg total) by mouth 2 (two) times daily. Qty: 60 tablet, Refills: 0         Aspirin prescribed at discharge?  Yes High Intensity Statin Prescribed? (Lipitor 40-80mg  or Crestor 20-40mg ): Yes Beta Blocker Prescribed? Yes Was Cardiac Rehab II ordered? (Included Medically managed Patients): Yes   Management plans discussed with the patient and he is in agreement. Stable for discharge home Patient should follow up with Dr Evette GeorgesParashos  CODE STATUS:     Code Status Orders        Start     Ordered   01/20/17 1951  Full code  Continuous     01/20/17 1950    Code Status History    Date Active Date Inactive Code Status Order ID Comments User Context   This patient has a current code status but no historical code status.      TOTAL TIME TAKING CARE OF THIS PATIENT: 37 minutes.    Note: This dictation was prepared with Dragon dictation along with smaller phrase technology. Any transcriptional errors that result from this process are unintentional.  Dung Salinger M.D on 01/24/2017 at 11:15 AM  Between 7am to 6pm - Pager -  [phone] After 6pm go to [email or web address] - Social research officer, governmentpassword EPAS [facility]  [facility]  Office  [phone]  CC: Primary care physician; Dorothey BasemanAVID BRONSTEIN, MD

## 2017-01-24 NOTE — Care Management Note (Addendum)
Case Management Note  Patient Details  Name: Maurice Rios MRN: [id number] Date of Birth: Mar 23, 1956  Subjective/Objective:                  Met with patient to discuss discharge planning. He plans to return home today with follow up in one week with Dr. Saralyn Pilar. Per patient Dr. Saralyn Pilar does not want him to return to work until follow up clearance with him next week. Patient independent in room when I rounded. His PCP is with Dr. Lovie Macadamia. His pharmacy is [facility] [address]   Action/Plan: Dr. Benjie Karvonen paged for work note excuse. Work note delivered to patient.  Brilinta coupon delivered to patient. Patient understands that he will need to call Dr. Saralyn Pilar for follow up appointment in one week for his convience. No other RNCM needs. Case closed.   Expected Discharge Date:  01/24/17               Expected Discharge Plan:     In-House Referral:  PCP / [facility]  Discharge planning Services  CM Consult  Post Acute Care Choice:    Choice offered to:  Patient  DME Arranged:    DME Agency:     HH Arranged:    [place] Agency:     Status of Service:  Completed, signed off  If discussed at [employer], dates discussed:    Additional Comments:  Maurice Garfinkel, RN 01/24/2017, 8:19 AM

## 2017-01-24 NOTE — Progress Notes (Signed)
[facility] [place] at [facility]        Delia Derrell LollingRamirez was admitted to the Hospital on 01/20/2017 and Discharged  01/24/2017 and should be excused from work/school   for 10days starting 01/20/2017 , may return to work/school without any restrictions.  Call Adrian SaranSital Chinedu Agustin MD with questions.  Juanna Pudlo M.D on 01/24/2017,at 7:59 AM  [facility] - [place] at [facility]    Office  [phone]\

## 2017-01-24 NOTE — Progress Notes (Signed)
Patient is discharge home in a stable condition, site good , no hematoma noted, soft to touch, site  care given , summary and f/u care given , verbalized understanding , left with daughter

## 2017-07-26 IMAGING — DX DG CHEST 1V PORT
1 series · 1 of 1 positions shown · non-contrast
Comparison: None.

CLINICAL DATA: 61 y/o  M; NSTEMI.

EXAM:
PORTABLE CHEST 1 VIEW

[chest ap]
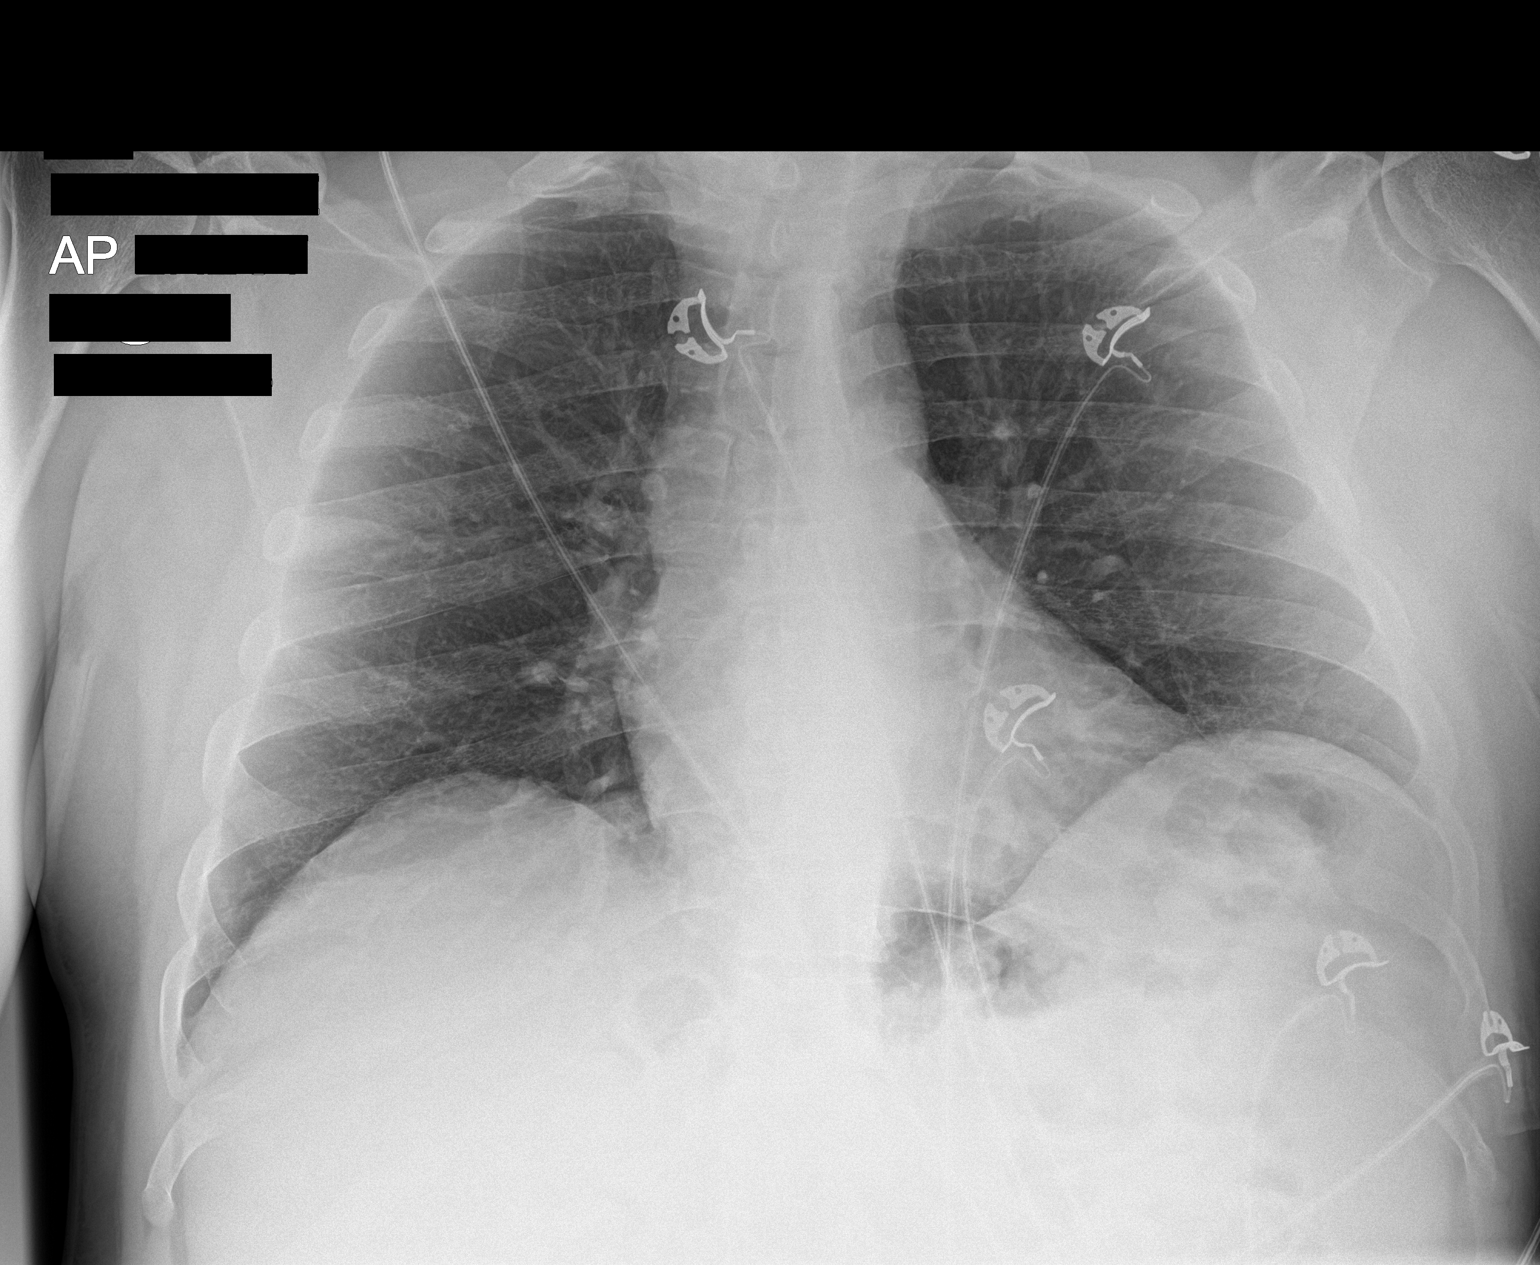

[1 of 1 positions shown; findings below may reference images not displayed]

FINDINGS: The heart size and mediastinal contours are within normal limits.
Both lungs are clear. The visualized skeletal structures are
unremarkable.
IMPRESSION: No active disease.

By: Lorenz Jumper M.D.

## 2017-07-30 IMAGING — CR DG ABDOMEN 1V
2 series · 2 of 2 positions shown · non-contrast
Comparison: CT abdomen and pelvis of 12/10/2008

CLINICAL DATA: Recent heart attack, now with lower abdomen pain, no
recent bowel movement

EXAM:
ABDOMEN - 1 VIEW

[abdomen kub (1 of 2)]
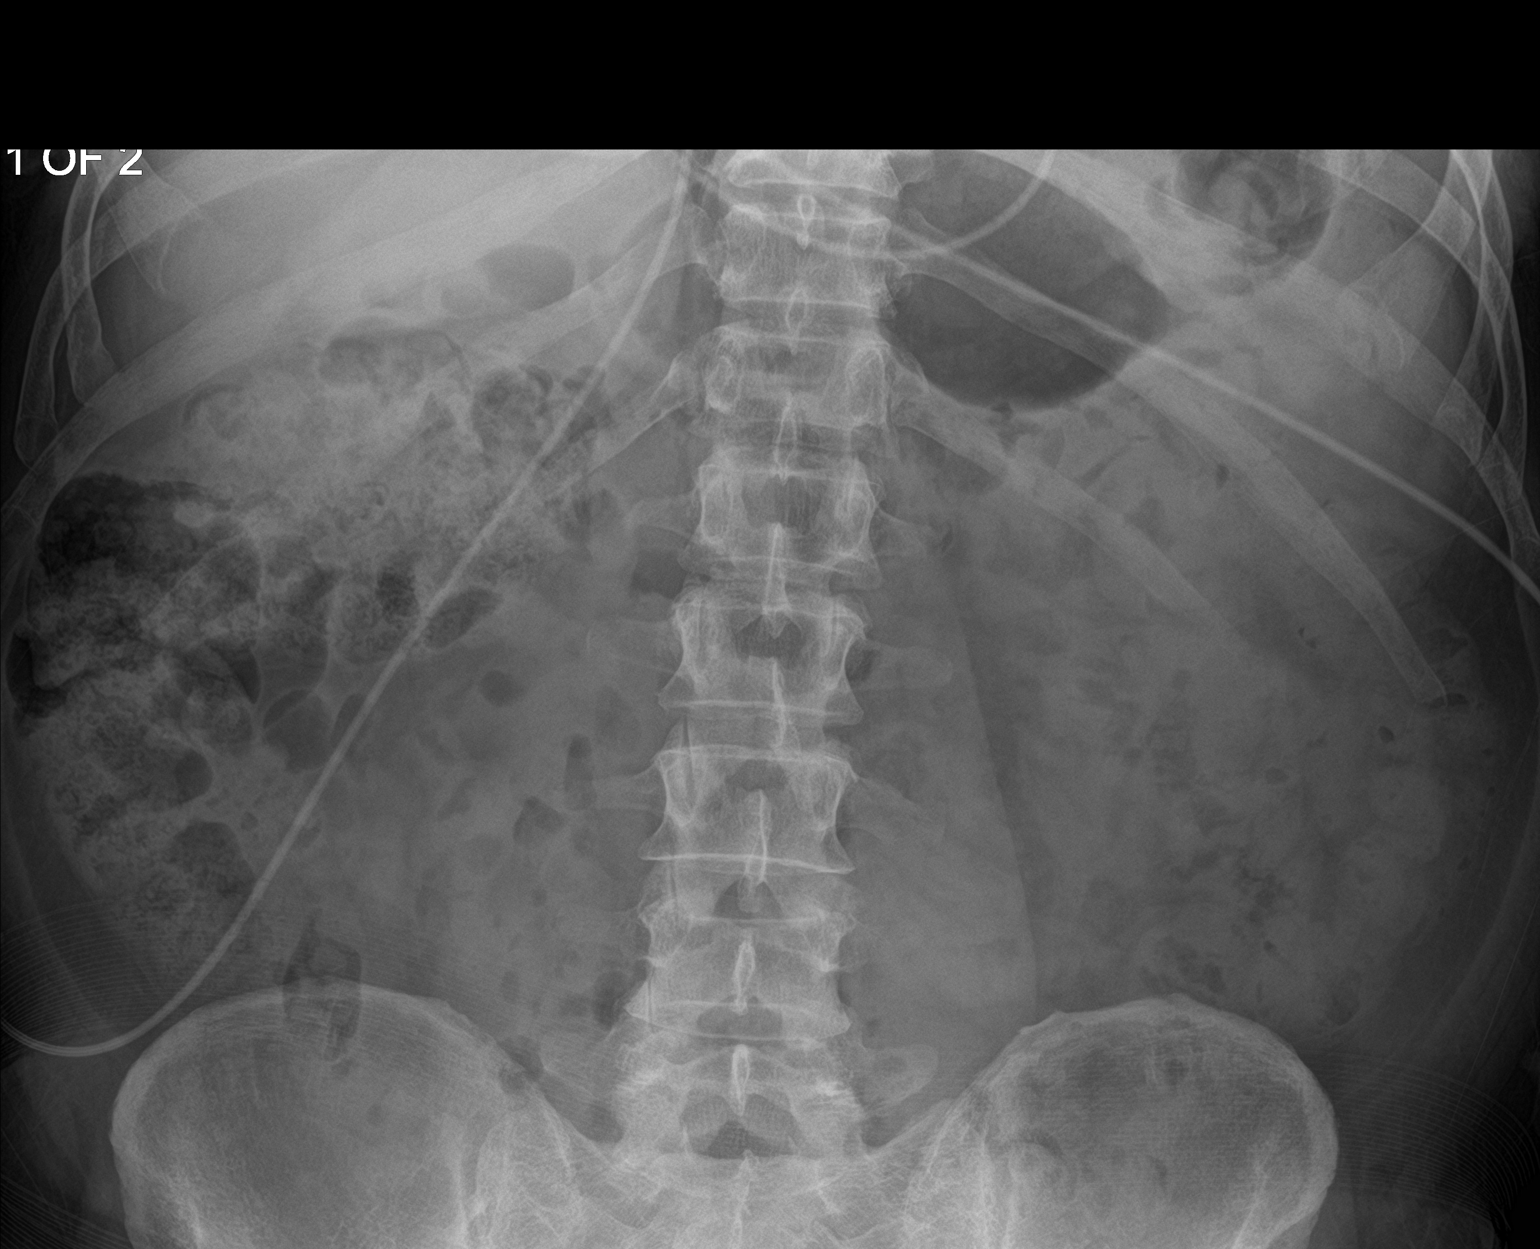

[abdomen kub (2 of 2)]
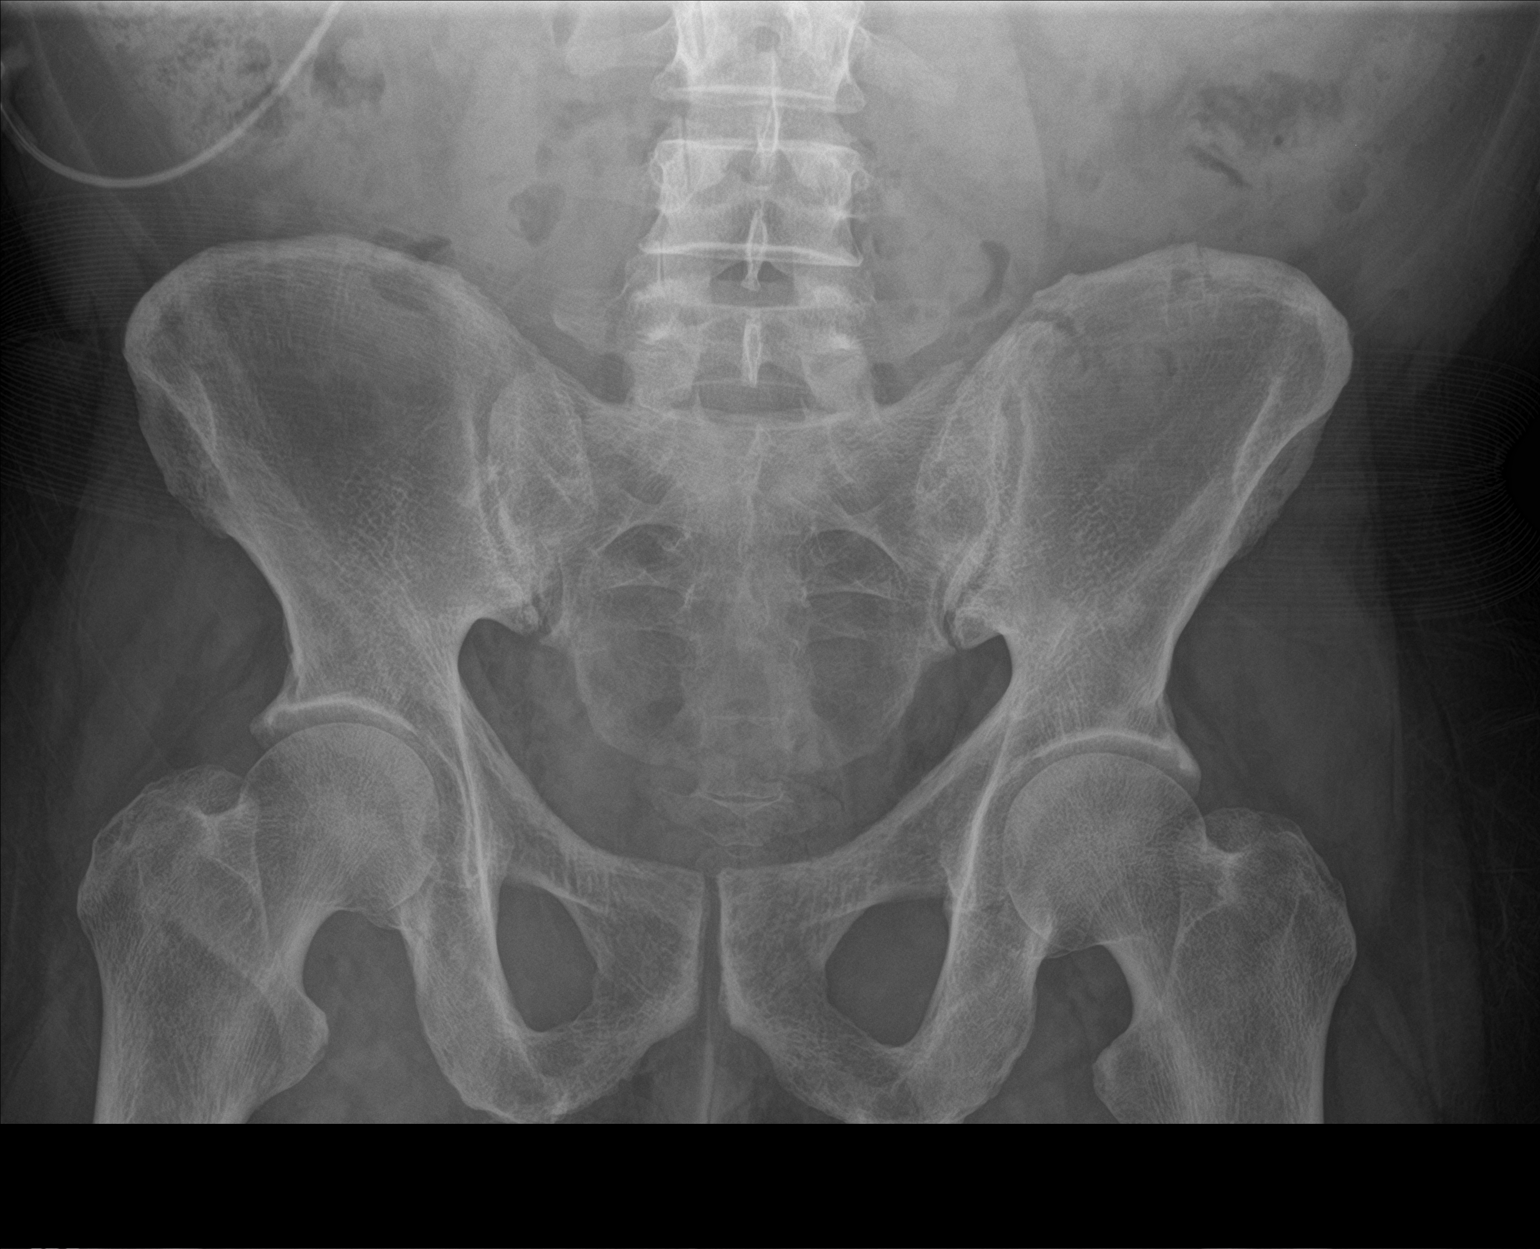

[2 of 2 positions shown; findings below may reference images not displayed]

FINDINGS: There is a moderate to large amount of feces throughout the entire
colon. No bowel obstruction is seen. No opaque calculi are noted. No
bony abnormality is seen.
IMPRESSION: Moderate to large amount of feces throughout the colon. No bowel
obstruction.

## 2018-02-19 ENCOUNTER — Encounter: Admission: RE | Payer: Self-pay | Source: Ambulatory Visit

## 2018-02-19 ENCOUNTER — Ambulatory Visit
Admission: RE | Admit: 2018-02-19 | Payer: BLUE CROSS/BLUE SHIELD | Source: Ambulatory Visit | Admitting: Gastroenterology

## 2018-02-19 SURGERY — COLONOSCOPY WITH PROPOFOL
Anesthesia: General

## 2018-04-17 ENCOUNTER — Encounter
Admission: RE | Disposition: A | Discharge status: Home / Self Care | Payer: Self-pay | Source: Ambulatory Visit | Attending: Gastroenterology

## 2018-04-17 ENCOUNTER — Encounter: Payer: Self-pay | Admitting: *Deleted

## 2018-04-17 ENCOUNTER — Ambulatory Visit: Payer: BLUE CROSS/BLUE SHIELD | Admitting: Anesthesiology

## 2018-04-17 ENCOUNTER — Ambulatory Visit
Admission: RE | Admit: 2018-04-17 | Discharge: 2018-04-17 | Disposition: A | Discharge status: Home / Self Care | Payer: BLUE CROSS/BLUE SHIELD | Source: Ambulatory Visit | Attending: Gastroenterology | Admitting: Gastroenterology

## 2018-04-17 DIAGNOSIS — Z7982 Long term (current) use of aspirin: Secondary | ICD-10-CM | POA: Diagnosis not present

## 2018-04-17 DIAGNOSIS — I2510 Atherosclerotic heart disease of native coronary artery without angina pectoris: Secondary | ICD-10-CM | POA: Insufficient documentation

## 2018-04-17 DIAGNOSIS — Z86010 Personal history of colonic polyps: Secondary | ICD-10-CM | POA: Insufficient documentation

## 2018-04-17 DIAGNOSIS — F329 Major depressive disorder, single episode, unspecified: Secondary | ICD-10-CM | POA: Diagnosis not present

## 2018-04-17 DIAGNOSIS — Z79899 Other long term (current) drug therapy: Secondary | ICD-10-CM | POA: Diagnosis not present

## 2018-04-17 DIAGNOSIS — K6389 Other specified diseases of intestine: Secondary | ICD-10-CM | POA: Diagnosis not present

## 2018-04-17 DIAGNOSIS — I252 Old myocardial infarction: Secondary | ICD-10-CM | POA: Diagnosis not present

## 2018-04-17 DIAGNOSIS — K635 Polyp of colon: Secondary | ICD-10-CM | POA: Diagnosis not present

## 2018-04-17 HISTORY — DX: Acute myocardial infarction, unspecified: I21.9

## 2018-04-17 HISTORY — DX: Atherosclerotic heart disease of native coronary artery without angina pectoris: I25.10

## 2018-04-17 HISTORY — PX: COLONOSCOPY WITH PROPOFOL: SHX5780

## 2018-04-17 SURGERY — COLONOSCOPY WITH PROPOFOL
Anesthesia: General

## 2018-04-17 MED ORDER — LIDOCAINE HCL (CARDIAC) PF 100 MG/5ML IV SOSY
PREFILLED_SYRINGE | INTRAVENOUS | Status: DC | PRN
  Administered 2018-04-17: 80 mg via INTRAVENOUS

## 2018-04-17 MED ORDER — PROPOFOL 10 MG/ML IV BOLUS
INTRAVENOUS | Status: AC
  Filled 2018-04-17: qty 20

## 2018-04-17 MED ORDER — PROPOFOL 10 MG/ML IV BOLUS
INTRAVENOUS | Status: DC | PRN
  Administered 2018-04-17: 80 mg via INTRAVENOUS

## 2018-04-17 MED ORDER — PROPOFOL 500 MG/50ML IV EMUL
INTRAVENOUS | Status: DC | PRN
  Administered 2018-04-17: 125 ug/kg/min via INTRAVENOUS

## 2018-04-17 MED ORDER — SODIUM CHLORIDE 0.9 % IV SOLN
INTRAVENOUS | Status: DC
  Administered 2018-04-17: 13:00:00 via INTRAVENOUS

## 2018-04-17 MED ORDER — PROPOFOL 10 MG/ML IV BOLUS
INTRAVENOUS | Status: AC
  Filled 2018-04-17: qty 40

## 2018-04-17 NOTE — Anesthesia Preprocedure Evaluation (Signed)
Anesthesia Evaluation  Patient identified by MRN, date of birth, ID band Patient awake    Reviewed: Allergy & Precautions, NPO status , Patient's Chart, lab work & pertinent test results, reviewed documented beta blocker date and time   Airway Mallampati: III  TM Distance: >3 FB     Dental  (+) Chipped   Pulmonary           Cardiovascular + CAD, + Past MI and + Cardiac Stents       Neuro/Psych PSYCHIATRIC DISORDERS Depression    GI/Hepatic   Endo/Other    Renal/GU      Musculoskeletal  (+) Arthritis ,   Abdominal   Peds  Hematology   Anesthesia Other Findings Does not use NTG. B -blockers .  Reproductive/Obstetrics                             Anesthesia Physical Anesthesia Plan  ASA: III  Anesthesia Plan: General   Post-op Pain Management:    Induction: Intravenous  PONV Risk Score and Plan:   Airway Management Planned:   Additional Equipment:   Intra-op Plan:   Post-operative Plan:   Informed Consent: I have reviewed the patients History and Physical, chart, labs and discussed the procedure including the risks, benefits and alternatives for the proposed anesthesia with the patient or authorized representative who has indicated his/her understanding and acceptance.     Plan Discussed with: CRNA  Anesthesia Plan Comments:         Anesthesia Quick Evaluation

## 2018-04-17 NOTE — Anesthesia Postprocedure Evaluation (Signed)
Anesthesia Post Note  Patient: Maurice Rios  Procedure(s) Performed: COLONOSCOPY WITH PROPOFOL (N/A )  Patient location during evaluation: Endoscopy Anesthesia Type: General Level of consciousness: awake and alert Pain management: pain level controlled Vital Signs Assessment: post-procedure vital signs reviewed and stable Respiratory status: spontaneous breathing, nonlabored ventilation, respiratory function stable and patient connected to nasal cannula oxygen Cardiovascular status: blood pressure returned to baseline and stable Postop Assessment: no apparent nausea or vomiting Anesthetic complications: no     Last Vitals:  Vitals:   04/17/18 1227 04/17/18 1439  BP: 136/83 90/76  Pulse: 67   Resp: 20   Temp: 36.6 C (!) 36.2 C  SpO2: 96%     Last Pain:  Vitals:   04/17/18 1509  TempSrc:   PainSc: 0-No pain                 Noelani Harbach S

## 2018-04-17 NOTE — Transfer of Care (Signed)
Immediate Anesthesia Transfer of Care Note  Patient: Maurice Rios  Procedure(s) Performed: COLONOSCOPY WITH PROPOFOL (N/A )  Patient Location: PACU  Anesthesia Type:General  Level of Consciousness: awake and sedated  Airway & Oxygen Therapy: Patient Spontanous Breathing and Patient connected to nasal cannula oxygen  Post-op Assessment: Report given to RN and Post -op Vital signs reviewed and stable  Post vital signs: Reviewed and stable  Last Vitals:  Vitals Value Taken Time  BP    Temp    Pulse 79 04/17/2018  2:38 PM  Resp 16 04/17/2018  2:38 PM  SpO2 97 % 04/17/2018  2:38 PM  Vitals shown include unvalidated device data.  Last Pain:  Vitals:   04/17/18 1227  TempSrc: Tympanic  PainSc: 0-No pain         Complications: No apparent anesthesia complications

## 2018-04-17 NOTE — Op Note (Signed)
[facility] Gastroenterology Patient Name: Maurice Rios Procedure Date: 04/17/2018 1:51 PM MRN: [id number] Account #: [id number] Date of Birth: 1955/12/25 Admit Type: Outpatient Age: 62 Room: [facility] ENDO ROOM 2 Gender: Male Note Status: Finalized Procedure:            Colonoscopy Indications:          Personal history of colonic polyps Providers:            Christena Deem, MD Referring MD:         Teena Irani. Terance Hart, MD (Referring MD) Medicines:            Monitored Anesthesia Care Complications:        No immediate complications. Procedure:            Pre-Anesthesia Assessment:                       - ASA Grade Assessment: III - A patient with severe                        systemic disease.                       After obtaining informed consent, the colonoscope was                        passed under direct vision. Throughout the procedure,                        the patient's blood pressure, pulse, and oxygen                        saturations were monitored continuously. The                        Colonoscope was introduced through the anus and                        advanced to the the cecum, identified by appendiceal                        orifice and ileocecal valve. The colonoscopy was                        performed without difficulty. The patient tolerated the                        procedure well. The quality of the bowel preparation                        was fair. Findings:      A 5 mm polyp was found in the descending colon. The polyp was sessile.       The polyp was removed with a piecemeal technique using a cold biopsy       forceps. Resection and retrieval were complete.      A 5 mm polyp was found in the proximal descending colon. The polyp was       sessile. The polyp was removed with a lift and cut technique using a       cold snare. Resection and retrieval were complete.      The retroflexed view of the  distal rectum and anal verge was  normal and       showed no anal or rectal abnormalities. Impression:           - Preparation of the colon was fair.                       - One 5 mm polyp in the descending colon, removed                        piecemeal using a cold biopsy forceps. Resected and                        retrieved.                       - One 5 mm polyp in the proximal descending colon,                        removed using lift and cut and a cold snare. Resected                        and retrieved.                       - The distal rectum and anal verge are normal on                        retroflexion view. Recommendation:       - Discharge patient to home.                       - Await pathology results.                       - Telephone GI clinic for pathology results in 1 week. Procedure Code(s):    --- Professional ---                       [phone], Colonoscopy, flexible; with removal of tumor(s),                        polyp(s), or other lesion(s) by snare technique                       45380, 59, Colonoscopy, flexible; with biopsy, single                        or multiple Diagnosis Code(s):    --- Professional ---                       D12.4, Benign neoplasm of descending colon                       Z86.010, Personal history of colonic polyps CPT copyright 2017 [employer]. All rights reserved. The codes documented in this report are preliminary and upon coder review may  be revised to meet current compliance requirements. Christena Deem, MD 04/17/2018 2:36:22 PM This report has been signed electronically. Number of Addenda: 0 Note Initiated On: 04/17/2018 1:51 PM Scope Withdrawal Time: 0 hours 21 minutes 16 seconds  Total Procedure Duration: 0 hours 28 minutes 52 seconds  [facility]

## 2018-04-17 NOTE — H&P (Signed)
Outpatient short stay form Pre-procedure 04/17/2018 1:47 PM Maurice Deem MD  Primary Physician: Dorothey Baseman, MD  Reason for visit: Colonoscopy  History of present illness: Patient is a 62 year old male resenting today as above.  He tolerated his prep well.  He does take both Brilinta and 81 mg aspirin.  The Brilinta has been held for 7+1 days.  He also had discontinued his 81 mg aspirin several days ago.  He has had a colonoscopy in the past, 2010, the finding of a adenomatous colon polyp.  There was recommendation to repeat in 5 years. He tolerated his prep well.  He takes no other aspirin or blood thinning agent.   Current Facility-Administered Medications:  .  0.9 %  sodium chloride infusion, , Intravenous, Continuous, Maurice Deem, MD, Last Rate: 20 mL/hr at 04/17/18 1243  Medications Prior to Admission  Medication Sig Dispense Refill Last Dose  . aspirin EC 81 MG tablet Take 81 mg by mouth daily.   Past Week at Unknown time  . atorvastatin (LIPITOR) 40 MG tablet Take 1 tablet (40 mg total) by mouth daily at 6 PM. 30 tablet 0 Past Week at Unknown time  . metoprolol tartrate (LOPRESSOR) 25 MG tablet Take 0.5 tablets (12.5 mg total) by mouth 2 (two) times daily. 60 tablet 0 Past Week at Unknown time  . polyethylene glycol (MIRALAX / GLYCOLAX) packet Take 17 g by mouth daily. 14 each 0 04/16/2018 at Unknown time  . senna-docusate (SENOKOT-S) 8.6-50 MG tablet Take 1 tablet by mouth at bedtime as needed for mild constipation. 30 tablet 0 Past Week at Unknown time  . ticagrelor (BRILINTA) 90 MG TABS tablet Take 1 tablet (90 mg total) by mouth 2 (two) times daily. 60 tablet 0 Past Week at Unknown time  . nitroGLYCERIN (NITROSTAT) 0.4 MG SL tablet Place 1 tablet (0.4 mg total) under the tongue every 5 (five) minutes x 3 doses as needed for chest pain. 30 tablet 0      No Known Allergies   Past Medical History:  Diagnosis Date  . Arthritis   . Coronary artery disease   .  Depression   . Impaired fasting glucose   . Myocardial infarction [facility])     Review of systems:      Physical Exam    Heart and lungs: Regular rate and rhythm without rub or gallop, lungs are bilaterally clear.    HEENT: Normocephalic atraumatic eyes are anicteric    Other:    Pertinant exam for procedure: Soft nontender nondistended bowel sounds positive normoactive    Planned proceedures: Colonoscopy and indicated procedures. I have discussed the risks benefits and complications of procedures to include not limited to bleeding, infection, perforation and the risk of sedation and the patient wishes to proceed.    Maurice Deem, MD Gastroenterology 04/17/2018  1:47 PM

## 2018-04-17 NOTE — Anesthesia Post-op Follow-up Note (Signed)
Anesthesia QCDR form completed.        

## 2018-04-18 ENCOUNTER — Encounter: Payer: Self-pay | Admitting: Gastroenterology

## 2018-04-19 LAB — SURGICAL PATHOLOGY

## 2024-04-01 ENCOUNTER — Ambulatory Visit: Payer: Self-pay

## 2024-04-01 DIAGNOSIS — K5730 Diverticulosis of large intestine without perforation or abscess without bleeding: Secondary | ICD-10-CM

## 2024-04-01 DIAGNOSIS — Z860101 Personal history of adenomatous and serrated colon polyps: Secondary | ICD-10-CM | POA: Diagnosis not present

## 2024-04-01 DIAGNOSIS — K640 First degree hemorrhoids: Secondary | ICD-10-CM | POA: Diagnosis not present

## 2024-04-01 DIAGNOSIS — Z09 Encounter for follow-up examination after completed treatment for conditions other than malignant neoplasm: Secondary | ICD-10-CM
